# Patient Record
Sex: Female | Born: 1990 | Race: Black or African American | Hispanic: No | Marital: Single | Smoking: Never smoker
Health system: Southern US, Community
[De-identification: ages and names within clinical notes are randomized; demographics above are authoritative.]

## PROBLEM LIST (undated history)

## (undated) ENCOUNTER — Inpatient Hospital Stay (HOSPITAL_COMMUNITY): Payer: Self-pay

## (undated) DIAGNOSIS — F32A Depression, unspecified: Secondary | ICD-10-CM

## (undated) DIAGNOSIS — F329 Major depressive disorder, single episode, unspecified: Secondary | ICD-10-CM

## (undated) DIAGNOSIS — N390 Urinary tract infection, site not specified: Secondary | ICD-10-CM

## (undated) DIAGNOSIS — O039 Complete or unspecified spontaneous abortion without complication: Secondary | ICD-10-CM

## (undated) DIAGNOSIS — B009 Herpesviral infection, unspecified: Secondary | ICD-10-CM

## (undated) DIAGNOSIS — A749 Chlamydial infection, unspecified: Secondary | ICD-10-CM

## (undated) DIAGNOSIS — R071 Chest pain on breathing: Secondary | ICD-10-CM

## (undated) DIAGNOSIS — I10 Essential (primary) hypertension: Secondary | ICD-10-CM

## (undated) DIAGNOSIS — F419 Anxiety disorder, unspecified: Secondary | ICD-10-CM

## (undated) DIAGNOSIS — Z3490 Encounter for supervision of normal pregnancy, unspecified, unspecified trimester: Secondary | ICD-10-CM

## (undated) DIAGNOSIS — I2699 Other pulmonary embolism without acute cor pulmonale: Secondary | ICD-10-CM

## (undated) HISTORY — DX: Complete or unspecified spontaneous abortion without complication: O03.9

## (undated) HISTORY — DX: Chest pain on breathing: R07.1

## (undated) HISTORY — PX: THERAPEUTIC ABORTION: SHX798

## (undated) HISTORY — DX: Encounter for supervision of normal pregnancy, unspecified, unspecified trimester: Z34.90

## (undated) HISTORY — DX: Herpesviral infection, unspecified: B00.9

---
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
## 2012-05-31 LAB — OB RESULTS CONSOLE ANTIBODY SCREEN: Antibody Screen: NEGATIVE

## 2012-05-31 LAB — OB RESULTS CONSOLE GC/CHLAMYDIA
Chlamydia: NEGATIVE
Gonorrhea: NEGATIVE

## 2012-05-31 LAB — OB RESULTS CONSOLE RUBELLA ANTIBODY, IGM: Rubella: IMMUNE

## 2012-09-25 ENCOUNTER — Inpatient Hospital Stay (HOSPITAL_COMMUNITY)
Admission: AD | Admit: 2012-09-25 | Discharge: 2012-09-25 | Disposition: A | Discharge status: Home / Self Care | Payer: Medicaid Other | Source: Ambulatory Visit | Attending: Obstetrics and Gynecology | Admitting: Obstetrics and Gynecology

## 2012-09-25 ENCOUNTER — Encounter (HOSPITAL_COMMUNITY): Payer: Self-pay | Admitting: *Deleted

## 2012-09-25 DIAGNOSIS — O34219 Maternal care for unspecified type scar from previous cesarean delivery: Secondary | ICD-10-CM

## 2012-09-25 DIAGNOSIS — O99891 Other specified diseases and conditions complicating pregnancy: Secondary | ICD-10-CM | POA: Insufficient documentation

## 2012-09-25 DIAGNOSIS — N760 Acute vaginitis: Secondary | ICD-10-CM | POA: Insufficient documentation

## 2012-09-25 DIAGNOSIS — O2390 Unspecified genitourinary tract infection in pregnancy, unspecified trimester: Secondary | ICD-10-CM | POA: Insufficient documentation

## 2012-09-25 DIAGNOSIS — B9689 Other specified bacterial agents as the cause of diseases classified elsewhere: Secondary | ICD-10-CM | POA: Insufficient documentation

## 2012-09-25 DIAGNOSIS — A499 Bacterial infection, unspecified: Secondary | ICD-10-CM | POA: Insufficient documentation

## 2012-09-25 HISTORY — DX: Depression, unspecified: F32.A

## 2012-09-25 HISTORY — DX: Urinary tract infection, site not specified: N39.0

## 2012-09-25 HISTORY — DX: Chlamydial infection, unspecified: A74.9

## 2012-09-25 HISTORY — DX: Anxiety disorder, unspecified: F41.9

## 2012-09-25 HISTORY — DX: Major depressive disorder, single episode, unspecified: F32.9

## 2012-09-25 LAB — URINALYSIS, ROUTINE W REFLEX MICROSCOPIC
Bilirubin Urine: NEGATIVE
Hgb urine dipstick: NEGATIVE
Ketones, ur: NEGATIVE mg/dL
Protein, ur: NEGATIVE mg/dL
Urobilinogen, UA: 1 mg/dL (ref 0.0–1.0)

## 2012-09-25 LAB — POCT FERN TEST: Fern Test: NEGATIVE

## 2012-09-25 MED ORDER — METRONIDAZOLE 500 MG PO TABS: 500.0000 mg | ORAL_TABLET | Freq: Two times a day (BID) | ORAL | Status: AC

## 2012-09-25 NOTE — MAU Provider Note (Signed)
History     CSN: [id number]  Arrival date and time: 09/25/12 6644   First Provider Initiated Contact with Patient 09/25/12 0945     Chief Complaint  Patient presents with  . Leaking fluid    HPI Debra Townsend y.o.G3P1011 [redacted]w[redacted]d with a previous history of PROM at [redacted] weeks gestation with a previous pregnancy, presented to the MAU today with a history of leaking a small amount of fluid around 0400 this morning. She reports she got up to void at that time and could not produce urine. She noticed her pajama pants were a little wet, but did not lok to see if it it had a color. She went back to bed. She wore a pad in and did not notice anything on it when she removed it. She denies dysuria, frequency or feelings of incomplete void. She denies fever or vaginal bleeding. She has felt her baby move and reports it is active. She has had no complications with this pregnancy and reports no complications with prior pregnancies except for the PROM with her last pregnancy.  She endorses some back pian over the last couple of days. She is not feeling any contractions.  OB History    Grav Para Term Preterm Abortions TAB SAB Ect Mult Living   3 1 1  0 1 1 0 0  1      Past Medical History  Diagnosis Date  . Urinary tract infection   . Asthma     childhood  . Anxiety   . Depression     No treatment, being monitored by Dr. Emelda Fear  . Chlamydia     Past Surgical History  Procedure Date  . Therapeutic abortion   . Cesarean section     History reviewed. No pertinent family history.  History  Substance Use Topics  . Smoking status: Former Smoker    Types: Cigarettes  . Smokeless tobacco: Never Used  . Alcohol Use: No    Allergies:  Allergies  Allergen Reactions  . Penicillins Hives    No prescriptions prior to admission    ROS Physical Exam   Blood pressure 110/65, pulse 100, temperature 97.5 F (36.4 C), temperature source Oral, resp. rate 18, height 5' (1.524 m), weight  78.019 kg (172 lb).  Physical Exam  Constitutional: She appears well-developed and well-nourished. No distress.  Eyes: Pupils are equal, round, and reactive to light. Right eye exhibits no discharge. Left eye exhibits no discharge. No scleral icterus.  Cardiovascular: Normal rate, regular rhythm and normal heart sounds.  Exam reveals no gallop and no friction rub.   No murmur heard. Respiratory: Effort normal and breath sounds normal. No respiratory distress. She has no wheezes. She has no rales.  GI: Soft. Bowel sounds are normal.       Gravid No contractions palpated  Genitourinary: Vaginal discharge found.       Spec: Patient was pulling away from exam d/t dryness. Normal external genitalia. No lesions or ulcers. Normal vagina mucosa. Small amount of white discharge noted. No odor. No pooling or fluid noted. No fluid noted on pad. Cervical OS visualized and no fluid or discharge from the os. Os was closed. Bimanual: Closed/thick/posterior  Skin: She is not diaphoretic.   EFM: FHR: 135's with accelerations. No contractions.  MAU Course  Procedures Fern test Wet prep EFM  Assessment and Plan  1. Fern- NEGATIVE 2. Wet prep- Positive for clue cells 3. Bacteria Vaginosis: Flagyl BID x7 days 4. Discharge home with preterm labor/PPROM  AVS Felix Pacini 09/25/2012, 10:16 AM   Seen and agree  Wynelle Bourgeois CNM

## 2012-09-25 NOTE — MAU Note (Signed)
Felt leaking around 0400. Intermittent leaking since then. Cramping, "pulling" started shortly after first episode of leaking. Goes to [facility]. Has appointment today. Did not feel she could wait.

## 2012-09-26 NOTE — MAU Provider Note (Signed)
Attestation of Attending Supervision of Advanced Practitioner (CNM/NP): Evaluation and management procedures were performed by the Advanced Practitioner under my supervision and collaboration.  I have reviewed the Advanced Practitioner's note and chart, and I agree with the management and plan.  Lindell Renfrew 09/26/2012 5:29 AM

## 2012-10-25 LAB — OB RESULTS CONSOLE GBS: GBS: NEGATIVE

## 2012-11-06 ENCOUNTER — Inpatient Hospital Stay (HOSPITAL_COMMUNITY)
Admission: AD | Admit: 2012-11-06 | Discharge: 2012-11-06 | Disposition: A | Discharge status: Home / Self Care | Payer: Medicaid Other | Source: Ambulatory Visit | Attending: Obstetrics & Gynecology | Admitting: Obstetrics & Gynecology

## 2012-11-06 ENCOUNTER — Encounter (HOSPITAL_COMMUNITY): Payer: Self-pay | Admitting: *Deleted

## 2012-11-06 DIAGNOSIS — O212 Late vomiting of pregnancy: Secondary | ICD-10-CM | POA: Insufficient documentation

## 2012-11-06 DIAGNOSIS — K2990 Gastroduodenitis, unspecified, without bleeding: Secondary | ICD-10-CM | POA: Insufficient documentation

## 2012-11-06 DIAGNOSIS — K2970 Gastritis, unspecified, without bleeding: Secondary | ICD-10-CM | POA: Insufficient documentation

## 2012-11-06 DIAGNOSIS — O99891 Other specified diseases and conditions complicating pregnancy: Secondary | ICD-10-CM | POA: Insufficient documentation

## 2012-11-06 DIAGNOSIS — O34219 Maternal care for unspecified type scar from previous cesarean delivery: Secondary | ICD-10-CM

## 2012-11-06 DIAGNOSIS — R197 Diarrhea, unspecified: Secondary | ICD-10-CM | POA: Insufficient documentation

## 2012-11-06 LAB — URINALYSIS, ROUTINE W REFLEX MICROSCOPIC
Ketones, ur: NEGATIVE mg/dL
Leukocytes, UA: NEGATIVE
Nitrite: NEGATIVE
Specific Gravity, Urine: 1.02 (ref 1.005–1.030)
pH: 6.5 (ref 5.0–8.0)

## 2012-11-06 MED ORDER — PROMETHAZINE HCL 50 MG PO TABS: 50.0000 mg | ORAL_TABLET | Freq: Four times a day (QID) | ORAL | Status: DC | PRN

## 2012-11-06 MED ORDER — PROMETHAZINE HCL 25 MG PO TABS
25.0000 mg | ORAL_TABLET | Freq: Four times a day (QID) | ORAL | Status: DC | PRN
  Administered 2012-11-06: 25 mg via ORAL
  Filled 2012-11-06: qty 1

## 2012-11-06 NOTE — MAU Provider Note (Signed)
Patient was seen and examined by Cam Hai, CNM who agreed with the plan as outlined by Corky Downs.  Attestation of Attending Supervision of Advanced Practitioner (CNM/NP): Evaluation and management procedures were performed by the Advanced Practitioner under my supervision and collaboration.  I have reviewed the Advanced Practitioner's note and chart, and I agree with the management and plan.  Jaynie Collins, MD, FACOG Attending Obstetrician & Gynecologist Faculty Practice, [facility]

## 2012-11-06 NOTE — MAU Provider Note (Signed)
History     CSN: [id number]  Arrival date and time: 11/06/12 1025   None     Chief Complaint  Patient presents with  . Nausea  . Diarrhea  . Abdominal Cramping   HPI  Debra Townsend is a 21 y.o. G3P1011 at [redacted]w[redacted]d presenting with nausea, vomiting, and diarrhea that started this morning. She also has suprapubic pressure. Denies fever, chills, vaginal discharge or leakage. No contractions. She is concerned she is in labor because this is the same thing that happened with her previous pregnancy. She denies exposure to ill contacts but did visit her Aunt at the hospital yesterday.   Past Medical History  Diagnosis Date  . Urinary tract infection   . Asthma     childhood  . Anxiety   . Depression     No treatment, being monitored by Dr. Emelda Fear  . Chlamydia     Past Surgical History  Procedure Date  . Therapeutic abortion   . Cesarean section     Family History  Problem Relation Age of Onset  . Other Neg Hx     History  Substance Use Topics  . Smoking status: Former Smoker    Types: Cigarettes  . Smokeless tobacco: Never Used  . Alcohol Use: No    Allergies:  Allergies  Allergen Reactions  . Penicillins Hives    Prescriptions prior to admission  Medication Sig Dispense Refill  . acyclovir (ZOVIRAX) 400 MG tablet Take 400 mg by mouth 3 (three) times daily.        Review of Systems  Constitutional: Negative for fever, chills and diaphoresis.  Gastrointestinal: Positive for nausea, vomiting, abdominal pain and diarrhea. Negative for blood in stool.  Genitourinary: Negative for dysuria, hematuria and flank pain.  Musculoskeletal: Positive for back pain.  Neurological: Negative for weakness.   Physical Exam   Blood pressure 113/75, pulse 94, temperature 97.3 F (36.3 C), temperature source Oral, resp. rate 18, height 5' (1.524 m), weight 78.472 kg (173 lb).  Physical Exam  Constitutional: She is oriented to person, place, and time. She appears  well-developed and well-nourished. No distress.  Cardiovascular: Normal rate, regular rhythm and normal heart sounds.   Respiratory: Effort normal and breath sounds normal.  GI: Normal appearance and bowel sounds are normal. There is tenderness in the suprapubic area. There is no CVA tenderness.  Neurological: She is alert and oriented to person, place, and time.  Skin: Skin is warm and dry. She is not diaphoretic.  Psychiatric: She has a normal mood and affect. Her behavior is normal.  EFM: baseline 140s, + accels, no decels Cx: 0/thick/posterior, vtx -3  MAU Course  Procedures  MDM  Results for orders placed during the hospital encounter of 11/06/12 (from the past 24 hour(s))  URINALYSIS, ROUTINE W REFLEX MICROSCOPIC     Status: Normal   Collection Time   11/06/12 10:40 AM      Component Value Range   Color, Urine YELLOW  YELLOW   APPearance CLEAR  CLEAR   Specific Gravity, Urine 1.020  1.005 - 1.030   pH 6.5  5.0 - 8.0   Glucose, UA NEGATIVE  NEGATIVE mg/dL   Hgb urine dipstick NEGATIVE  NEGATIVE   Bilirubin Urine NEGATIVE  NEGATIVE   Ketones, ur NEGATIVE  NEGATIVE mg/dL   Protein, ur NEGATIVE  NEGATIVE mg/dL   Urobilinogen, UA 0.2  0.0 - 1.0 mg/dL   Nitrite NEGATIVE  NEGATIVE   Leukocytes, UA NEGATIVE  NEGATIVE  Assessment and Plan  IUP at 38.2wks Nausea, vomiting, and diarrhea secondary to gastritis Prescription for phenergan to control nausea Encouraged to hydrate F/U with OB for routine care  Corky Downs 11/06/2012, 12:12 PM

## 2012-11-06 NOTE — MAU Note (Signed)
Symptoms started this morning, nausea, vomting and vomiting.  abd cramping- sharp pains.  Had been to see a family member in the hosp yesterday- ? Exposure to virus.

## 2012-11-14 ENCOUNTER — Encounter (HOSPITAL_COMMUNITY): Payer: Self-pay | Admitting: *Deleted

## 2012-11-14 ENCOUNTER — Inpatient Hospital Stay (HOSPITAL_COMMUNITY)
Admission: AD | Admit: 2012-11-14 | Discharge: 2012-11-18 | DRG: 765 | Disposition: A | Discharge status: Home / Self Care | Payer: Medicaid Other | Source: Ambulatory Visit | Attending: Obstetrics & Gynecology | Admitting: Obstetrics & Gynecology

## 2012-11-14 DIAGNOSIS — O9902 Anemia complicating childbirth: Secondary | ICD-10-CM | POA: Diagnosis present

## 2012-11-14 DIAGNOSIS — O4100X0 Oligohydramnios, unspecified trimester, not applicable or unspecified: Principal | ICD-10-CM | POA: Diagnosis present

## 2012-11-14 DIAGNOSIS — O34219 Maternal care for unspecified type scar from previous cesarean delivery: Secondary | ICD-10-CM | POA: Diagnosis present

## 2012-11-14 DIAGNOSIS — D573 Sickle-cell trait: Secondary | ICD-10-CM | POA: Diagnosis present

## 2012-11-14 DIAGNOSIS — O365990 Maternal care for other known or suspected poor fetal growth, unspecified trimester, not applicable or unspecified: Secondary | ICD-10-CM | POA: Diagnosis present

## 2012-11-14 LAB — TYPE AND SCREEN
ABO/RH(D): B POS
Antibody Screen: NEGATIVE

## 2012-11-14 LAB — RPR: RPR Ser Ql: NONREACTIVE

## 2012-11-14 LAB — CBC
Hemoglobin: 9.6 g/dL — ABNORMAL LOW (ref 12.0–15.0)
MCH: 23.6 pg — ABNORMAL LOW (ref 26.0–34.0)
MCHC: 31.1 g/dL (ref 30.0–36.0)

## 2012-11-14 LAB — RAPID URINE DRUG SCREEN, HOSP PERFORMED
Amphetamines: NOT DETECTED
Barbiturates: NOT DETECTED

## 2012-11-14 MED ORDER — OXYTOCIN BOLUS FROM INFUSION: 500.0000 mL | INTRAVENOUS | Status: DC

## 2012-11-14 MED ORDER — OXYCODONE-ACETAMINOPHEN 5-325 MG PO TABS: 1.0000 | ORAL_TABLET | ORAL | Status: DC | PRN

## 2012-11-14 MED ORDER — LACTATED RINGERS IV SOLN: 500.0000 mL | INTRAVENOUS | Status: DC | PRN

## 2012-11-14 MED ORDER — CITRIC ACID-SODIUM CITRATE 334-500 MG/5ML PO SOLN
30.0000 mL | ORAL | Status: DC | PRN
  Filled 2012-11-14: qty 15

## 2012-11-14 MED ORDER — FLEET ENEMA 7-19 GM/118ML RE ENEM: 1.0000 | ENEMA | RECTAL | Status: DC | PRN

## 2012-11-14 MED ORDER — ACYCLOVIR 400 MG PO TABS
400.0000 mg | ORAL_TABLET | Freq: Three times a day (TID) | ORAL | Status: DC
  Filled 2012-11-14 (×3): qty 1

## 2012-11-14 MED ORDER — IBUPROFEN 600 MG PO TABS: 600.0000 mg | ORAL_TABLET | Freq: Four times a day (QID) | ORAL | Status: DC | PRN

## 2012-11-14 MED ORDER — ACYCLOVIR 200 MG PO CAPS
400.0000 mg | ORAL_CAPSULE | Freq: Three times a day (TID) | ORAL | Status: DC
  Administered 2012-11-14 – 2012-11-15 (×4): 400 mg via ORAL
  Filled 2012-11-14 (×5): qty 2

## 2012-11-14 MED ORDER — ONDANSETRON HCL 4 MG/2ML IJ SOLN
4.0000 mg | Freq: Four times a day (QID) | INTRAMUSCULAR | Status: DC | PRN
  Administered 2012-11-16: 4 mg via INTRAVENOUS
  Filled 2012-11-14: qty 2

## 2012-11-14 MED ORDER — ACETAMINOPHEN 325 MG PO TABS
650.0000 mg | ORAL_TABLET | ORAL | Status: DC | PRN
  Administered 2012-11-14 – 2012-11-15 (×2): 650 mg via ORAL
  Filled 2012-11-14 (×2): qty 2

## 2012-11-14 MED ORDER — LIDOCAINE HCL (PF) 1 % IJ SOLN: 30.0000 mL | INTRAMUSCULAR | Status: DC | PRN

## 2012-11-14 MED ORDER — OXYTOCIN 40 UNITS IN LACTATED RINGERS INFUSION - SIMPLE MED: 62.5000 mL/h | INTRAVENOUS | Status: DC

## 2012-11-14 MED ORDER — LACTATED RINGERS IV SOLN
INTRAVENOUS | Status: DC
  Administered 2012-11-14: 125 mL/h via INTRAVENOUS
  Administered 2012-11-15 – 2012-11-16 (×5): via INTRAVENOUS

## 2012-11-14 NOTE — H&P (Signed)
Debra Townsend is a 21 y.o. female [redacted]w[redacted]d at [redacted]w[redacted]d by LMP consistent with 15w ultrasound, admitted for IOL for IUGR <3%ile with oligohydramnios (AFI 4.59); no growth noted on ultrasound since 11/20. Presentation is vertex. FHR wnl by Doppler in clinic. Previous pregnancy was a c-section for nonreassuring fetal heart tones at 36w, in 2012. Pt has signed consent for TOLAC though she has some questions regarding relative safety of c-section vs TOLAC for baby given IUGR.  Pt reports no bleeding or definite LOF; pt does have oligo and reports perhaps some trickling of fluids (states she has had a few times where she has urinated and thought she was finished, with some continued feeling of fluids after 'finishing'). Some reduced fetal movements over the past week compared to previously, but still feeling movements. Reports no headaches or change in vision (blurriness, double vision, spots/flashes), no RUQ pain; does endorse some blurred vision earlier in pregnancy and some last week. Reports no new/worse swelling/edema. No fever/chills, N/V.  Pt was seen for prenatal care by [facility]. BP elevated in clinic today, 140/80; pt with persistent 1+ protein on UA's throughout pregnancy.  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 Pt was positive for Chlamydia on 11/13 and received treatment; test of cure pending, specimen collected in clinic today. Hx UDS+ for THC during pregnancy. 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  Maternal Medical History:  Reason for admission: Here for IOL for oligohydramnios, IUGR <3%ile  Contractions: Denies contractions currently; some weak/intermittent over the last week  Fetal activity: Perceived fetal activity is recently changed.   Last perceived fetal movement was within the past hour.   Pt states she has felt less fetal movement than normal in the past week or so but is still feeling movements.  Prenatal complications: IUGR and oligohydramnios.   No bleeding  or pre-eclampsia.   PIH: few elevated BP's in clinic today and last week.   Prenatal Complications - Diabetes: none.    OB History    Grav Para Term Preterm Abortions TAB SAB Ect Mult Living   3 1 0 1 1 1  0 0  1     Past Medical History  Diagnosis Date  . Urinary tract infection   . Asthma     childhood  . Anxiety   . Depression     No treatment, being monitored by Dr. Emelda Fear  . Chlamydia    Past Surgical History  Procedure Date  . Therapeutic abortion   . Cesarean section    Family History: family history is negative for Other. Social History:  reports that she has quit smoking. Her smoking use included Cigarettes. She has never used smokeless tobacco. She reports that she uses illicit drugs (Marijuana). She reports that she does not drink alcohol.  ROS: See HPI    Filed Vitals:   11/14/12 2157  BP: 127/79  Pulse: 97  Temp:   Resp:     Maternal Exam:  Uterine Assessment: No current contractions  Abdomen: Surgical scars: low transverse.   Fetal presentation: vertex  Introitus: Normal vulva. Normal vagina.  Ferning test: not done.  Nitrazine test: not done. Amniotic fluid character: not assessed.     Fetal Exam Fetal Monitor Review: Baseline rate: 150.  Baseline appears to be around 150. However, there are almost continuous accelerations to 170s and 180s. Variability is moderate. There are one or two very short (10 sec), sharp variables early in the strip.     Physical  Exam  Vitals reviewed. Constitutional: She is oriented to person, place, and time. She appears well-developed and well-nourished.  HENT:  Head: Normocephalic and atraumatic.  Eyes: Conjunctivae normal are normal. Pupils are equal, round, and reactive to light.  Neck: Normal range of motion. Neck supple.  Cardiovascular: Normal rate, regular rhythm and normal heart sounds.   No murmur heard. Respiratory: Effort normal and breath sounds normal. She has no wheezes.  GI: Soft. She  exhibits no distension. There is no tenderness.       Gravid  Genitourinary: Pelvic exam was performed with patient prone.  Musculoskeletal: Normal range of motion. She exhibits no edema.  Neurological: She is alert and oriented to person, place, and time.  Skin: Skin is warm and dry.  Psychiatric: She has a normal mood and affect. Her behavior is normal.    Filed Vitals:   11/14/12 1830 11/14/12 1959 11/14/12 2157  BP: 121/62 89/73 127/79  Pulse: 107 96 97  Temp: 98.3 F (36.8 C) 98.1 F (36.7 C)   TempSrc: Oral Oral   Resp: 20 18   Height: 5' (1.524 m)    Weight: 79.379 kg (175 lb)       Prenatal labs: ABO, Rh: B/Positive/-- (06/19 0000) Antibody: Negative (06/19 0000) Rubella: Immune (06/19 0000) RPR:    HBsAg: Negative (06/19 0000)  HIV: Non-reactive (06/19 0000)  GBS: Negative (11/13 0000)   Assessment/Plan: 21 y.o. G3P1011 at [redacted]w[redacted]d by LMP consistent with 15w [place] presenting for IOL due to IUGR/oligo  Patient had previously signed consent for TOLAC but concerned about baby's ability to tolerate labor due to IUGR. Dr. Erin Fulling present to discuss risks and benefits of TOLAC vs c-section for baby and mother. Specifically, discussed risk of fetal intolerance to labor. Patient advised that we will monitor fetal heart rate continuously and very closely with low threshold for intervention with cesarean section if any signs of fetal distress. Discussed available methods of induction for TOLAC and patient advised that we can attempt foley bulb and eventually pitocin once cervix is favorable. After discussion, patient agreeable to proceed with TOLAC and induction starting with foley bulb.   - GBS negative - Chlamydia 11/13, treated, test of cure pending.  - Continue Acyclovir for HSV2 suppression. No visible lesions on exam - History of positive urine drug screen for THC earlier in pregnancy; repeat UDS - Elevated BP in last visit to office. BP normal here so far -  monitor.  Above discussed with Dr. Janine Limbo, Cristal Deer 11/14/2012, 6:22 PM  CO-SIGN I have seen and examined patient and agree with above.  Cervical exam:  1/50/5/-3, very posterior Foley bulb placed successfully, though difficult due to posterior cervix. Membranes stripped.  Vertex by exam. Fetal heart tracings reviewed.  Napoleon Form, MD

## 2012-11-14 NOTE — Progress Notes (Signed)
Debra Townsend is a 21 y.o. G3P1011 at [redacted]w[redacted]d by LMP admitted for induction of labor due to Low amniotic fluid and poor fetal growth. Pt had a prior c-section at 36 weeks due to non reassuring fetal heart rate.  She has been followed this pregnancy for poor growth and today was noted to have a low AFI of 4.3cm.  Pt admitted for delivery. Subjective:   Objective: BP 127/79  Pulse 97  Temp 98.1 F (36.7 C) (Oral)  Resp 18  Ht 5' (1.524 m)  Wt 79.379 kg (175 lb)  BMI 34.18 kg/m2       Labs: Lab Results  Component Value Date   WBC 9.1 11/14/2012   HGB 9.6* 11/14/2012   HCT 30.9* 11/14/2012   MCV 76.1* 11/14/2012   PLT 191 11/14/2012    Assessment / Plan: Pt at term with oligohydramnios and poor fetal growth.  reportedly normal umbilical dopplers.  D/w pt and mother the risks of trial of labor after c-section.  I also reviewed with her the potential risks of a fetus with poor growth.  I reviewed with her the risk of fetal compromise.  Pt and her mother wish to attempt with a vaginal delivery.  I explained to them that ANY fetal heart rate abnormalities could potentially result in a repeat c-section.  Both patient and  Her mother expressed understanding of the risks and plan of care. I spent 20-38min at the patients bedside explaining the risks and alternatives from.    Townsend, Debra Schlink 11/14/2012, 10:46 PM (late entry)

## 2012-11-14 NOTE — Progress Notes (Signed)
Dr Thad Ranger at bedside.  RN updated MD on fhr baseline

## 2012-11-15 ENCOUNTER — Encounter (HOSPITAL_COMMUNITY): Payer: Self-pay | Admitting: Anesthesiology

## 2012-11-15 LAB — CBC
HCT: 30.2 % — ABNORMAL LOW (ref 36.0–46.0)
Hemoglobin: 9.3 g/dL — ABNORMAL LOW (ref 12.0–15.0)
MCH: 23.7 pg — ABNORMAL LOW (ref 26.0–34.0)
MCHC: 30.8 g/dL (ref 30.0–36.0)

## 2012-11-15 MED ORDER — LIDOCAINE HCL (PF) 1 % IJ SOLN
INTRAMUSCULAR | Status: DC | PRN
  Administered 2012-11-15 (×2): 8 mL

## 2012-11-15 MED ORDER — LACTATED RINGERS IV SOLN: 500.0000 mL | Freq: Once | INTRAVENOUS | Status: DC

## 2012-11-15 MED ORDER — FENTANYL 2.5 MCG/ML BUPIVACAINE 1/10 % EPIDURAL INFUSION (WH - ANES)
14.0000 mL/h | INTRAMUSCULAR | Status: DC
Start: 2012-11-15 — End: 2012-11-16
  Administered 2012-11-16: 14 mL/h via EPIDURAL
  Filled 2012-11-15 (×2): qty 125

## 2012-11-15 MED ORDER — FENTANYL 2.5 MCG/ML BUPIVACAINE 1/10 % EPIDURAL INFUSION (WH - ANES)
INTRAMUSCULAR | Status: DC | PRN
  Administered 2012-11-15: 14 mL/h via EPIDURAL

## 2012-11-15 MED ORDER — PHENYLEPHRINE 40 MCG/ML (10ML) SYRINGE FOR IV PUSH (FOR BLOOD PRESSURE SUPPORT)
80.0000 ug | PREFILLED_SYRINGE | INTRAVENOUS | Status: DC | PRN
  Filled 2012-11-15: qty 5

## 2012-11-15 MED ORDER — FENTANYL CITRATE 0.05 MG/ML IJ SOLN
100.0000 ug | INTRAMUSCULAR | Status: DC | PRN
  Administered 2012-11-15: 100 ug via INTRAVENOUS
  Filled 2012-11-15: qty 2

## 2012-11-15 MED ORDER — TERBUTALINE SULFATE 1 MG/ML IJ SOLN: 0.2500 mg | Freq: Once | INTRAMUSCULAR | Status: AC | PRN

## 2012-11-15 MED ORDER — EPHEDRINE 5 MG/ML INJ
10.0000 mg | INTRAVENOUS | Status: DC | PRN
  Filled 2012-11-15: qty 4

## 2012-11-15 MED ORDER — PHENYLEPHRINE 40 MCG/ML (10ML) SYRINGE FOR IV PUSH (FOR BLOOD PRESSURE SUPPORT): 80.0000 ug | PREFILLED_SYRINGE | INTRAVENOUS | Status: DC | PRN

## 2012-11-15 MED ORDER — DIPHENHYDRAMINE HCL 50 MG/ML IJ SOLN
12.5000 mg | INTRAMUSCULAR | Status: DC | PRN
  Administered 2012-11-15: 12.5 mg via INTRAVENOUS
  Filled 2012-11-15: qty 1

## 2012-11-15 MED ORDER — EPHEDRINE 5 MG/ML INJ: 10.0000 mg | INTRAVENOUS | Status: DC | PRN

## 2012-11-15 MED ORDER — LIDOCAINE HCL 4 % IJ SOLN: INTRAMUSCULAR | Status: DC | PRN

## 2012-11-15 MED ORDER — OXYTOCIN 40 UNITS IN LACTATED RINGERS INFUSION - SIMPLE MED
1.0000 m[IU]/min | INTRAVENOUS | Status: DC
  Administered 2012-11-15: 2 m[IU]/min via INTRAVENOUS
  Filled 2012-11-15: qty 1000

## 2012-11-15 NOTE — Progress Notes (Addendum)
Debra Townsend is a 21 y.o. G3P1011 at [redacted]w[redacted]d   Subjective: Very uncomfortable with contractions, requesting pain medication  Objective: BP 116/81  Pulse 96  Temp 98.2 F (36.8 C) (Oral)  Resp 18  Ht 5' (1.524 m)  Wt 79.379 kg (175 lb)  BMI 34.18 kg/m2      FHT:  FHR: 130-140 bpm, variability: moderate,  accelerations:  Present,  decelerations:  Present occasional prolonged variables UC:   regular, every 3-5 minutes SVE:   Dilation: 5 Effacement (%): 80 Station: -2 Exam by:: Jade Burright, MD  Labs: Lab Results  Component Value Date   WBC 9.1 11/14/2012   HGB 9.6* 11/14/2012   HCT 30.9* 11/14/2012   MCV 76.1* 11/14/2012   PLT 191 11/14/2012    Assessment / Plan: Induction of labor due to IUGR and oligo,  progressing well on pitocin -s/p foley bulb, now 5 / 80 / -2; will avoid AROM given oligo  Labor: Progressing on Pitocin Fetal Wellbeing:  Category II Pain Control:  Fentanyl IV, order placed for epidural Anticipated MOD:  Anticipate VBAC  Murad Staples 11/15/2012, 6:58 PM

## 2012-11-15 NOTE — Progress Notes (Signed)
   Lamica Mccart is a 21 y.o. G3P1011 at [redacted]w[redacted]d  admitted for induction of labor due to Low amniotic fluid. and Poor fetal growth.  Subjective:  Feels better with epidural Objective: BP 109/68  Pulse 84  Temp 97.8 F (36.6 C) (Oral)  Resp 18  Ht 5' (1.524 m)  Wt 175 lb (79.379 kg)  BMI 34.18 kg/m2  SpO2 100%    FHT:  FHR: 140 bpm, variability: moderate,  accelerations:  Present,  decelerations:  Absent UC:   irregular, every 3-5 minutes SVE:   Dilation: 5.5 Effacement (%): 80 Station: -1 Exam by:: dr. Chelsea Aus Pitocin at 12 mu/min Labs: Lab Results  Component Value Date   WBC 10.5 11/15/2012   HGB 9.3* 11/15/2012   HCT 30.2* 11/15/2012   MCV 76.8* 11/15/2012   PLT 168 11/15/2012    Assessment / Plan: IOL fo r IUGR/oligohydramnios; slow progress; WIll increase pitocin  Labor: Progressing normally Fetal Wellbeing:  Category I Pain Control:  Epidural Anticipated MOD:  NSVD  CRESENZO-DISHMAN,Eloyse Causey 11/15/2012, 9:51 PM

## 2012-11-15 NOTE — Anesthesia Procedure Notes (Signed)
Epidural Patient location during procedure: OB Start time: 11/15/2012 8:27 PM End time: 11/15/2012 8:31 PM  Staffing Anesthesiologist: Sandrea Hughs Performed by: anesthesiologist   Preanesthetic Checklist Completed: patient identified, site marked, surgical consent, pre-op evaluation, timeout performed, IV checked, risks and benefits discussed and monitors and equipment checked  Epidural Patient position: sitting Prep: site prepped and draped and DuraPrep Patient monitoring: continuous pulse ox and blood pressure Approach: midline Injection technique: LOR air  Needle:  Needle type: Tuohy  Needle gauge: 17 G Needle length: 9 cm and 9 Needle insertion depth: 5 cm cm Catheter type: closed end flexible Catheter size: 19 Gauge Catheter at skin depth: 10 cm Test dose: negative and Other  Assessment Sensory level: T8 Events: blood not aspirated, injection not painful, no injection resistance, negative IV test and no paresthesia  Additional Notes Reason for block:procedure for pain

## 2012-11-15 NOTE — Progress Notes (Signed)
Debra Townsend is a 22 y.o. G3P1011 at 106w4d  admitted for induction of labor due to Low amniotic fluid. and IUGR.  She has a history of C/S x1 and desires TOLAC.  Subjective: Pt comfortable, denies need for medication at this time.   Objective: BP 111/57  Pulse 99  Temp 97.9 F (36.6 C) (Axillary)  Resp 18  Ht 5' (1.524 m)  Wt 79.379 kg (175 lb)  BMI 34.18 kg/m2      FHT:  FHR: 140 bpm, variability: moderate,  accelerations:  Present,  decelerations:  Absent UC:   occasional SVE:   Dilation: 4.5 Effacement (%): 60 Station: -3 Exam by:: [place], CNM  Labs: Lab Results  Component Value Date   WBC 9.1 11/14/2012   HGB 9.6* 11/14/2012   HCT 30.9* 11/14/2012   MCV 76.1* 11/14/2012   PLT 191 11/14/2012    Assessment / Plan: S/P foley bulb Plan to start Pitocin   Labor: Progressing normally Fetal Wellbeing:  Category I Pain Control:  Labor support without medications Anticipated MOD:  VBAC  LEFTWICH-KIRBY, Byanca Kasper 11/15/2012, 3:32 PM

## 2012-11-15 NOTE — Anesthesia Preprocedure Evaluation (Signed)
Anesthesia Evaluation  Patient identified by MRN, date of birth, ID band Patient awake    Reviewed: Allergy & Precautions, H&P , NPO status , Patient's Chart, lab work & pertinent test results  Airway Mallampati: II TM Distance: >3 FB Neck ROM: full    Dental No notable dental hx.    Pulmonary    Pulmonary exam normal       Cardiovascular negative cardio ROS      Neuro/Psych negative neurological ROS     GI/Hepatic negative GI ROS, Neg liver ROS,   Endo/Other  negative endocrine ROS  Renal/GU negative Renal ROS  negative genitourinary   Musculoskeletal negative musculoskeletal ROS (+)   Abdominal Normal abdominal exam  (+)   Peds negative pediatric ROS (+)  Hematology negative hematology ROS (+)   Anesthesia Other Findings   Reproductive/Obstetrics (+) Pregnancy                           Anesthesia Physical Anesthesia Plan  ASA: II  Anesthesia Plan: Epidural   Post-op Pain Management:    Induction:   Airway Management Planned:   Additional Equipment:   Intra-op Plan:   Post-operative Plan:   Informed Consent: I have reviewed the patients History and Physical, chart, labs and discussed the procedure including the risks, benefits and alternatives for the proposed anesthesia with the patient or authorized representative who has indicated his/her understanding and acceptance.     Plan Discussed with:   Anesthesia Plan Comments:         Anesthesia Quick Evaluation

## 2012-11-16 ENCOUNTER — Encounter (HOSPITAL_COMMUNITY): Payer: Self-pay | Admitting: *Deleted

## 2012-11-16 ENCOUNTER — Encounter (HOSPITAL_COMMUNITY): Payer: Self-pay | Admitting: Anesthesiology

## 2012-11-16 ENCOUNTER — Inpatient Hospital Stay (HOSPITAL_COMMUNITY): Payer: Medicaid Other | Admitting: Anesthesiology

## 2012-11-16 ENCOUNTER — Encounter (HOSPITAL_COMMUNITY)
Admission: AD | Disposition: A | Discharge status: Home / Self Care | Payer: Self-pay | Source: Ambulatory Visit | Attending: Obstetrics & Gynecology

## 2012-11-16 DIAGNOSIS — O34219 Maternal care for unspecified type scar from previous cesarean delivery: Secondary | ICD-10-CM

## 2012-11-16 DIAGNOSIS — O365990 Maternal care for other known or suspected poor fetal growth, unspecified trimester, not applicable or unspecified: Secondary | ICD-10-CM

## 2012-11-16 DIAGNOSIS — O9902 Anemia complicating childbirth: Secondary | ICD-10-CM

## 2012-11-16 DIAGNOSIS — O4100X0 Oligohydramnios, unspecified trimester, not applicable or unspecified: Secondary | ICD-10-CM

## 2012-11-16 SURGERY — Surgical Case
Anesthesia: Epidural | Site: Abdomen | Wound class: Clean Contaminated

## 2012-11-16 MED ORDER — LANOLIN HYDROUS EX OINT: 1.0000 "application " | TOPICAL_OINTMENT | CUTANEOUS | Status: DC | PRN

## 2012-11-16 MED ORDER — LACTATED RINGERS IV SOLN
INTRAVENOUS | Status: DC
  Administered 2012-11-16: 01:00:00 via INTRAUTERINE

## 2012-11-16 MED ORDER — METOCLOPRAMIDE HCL 5 MG/ML IJ SOLN: 10.0000 mg | Freq: Three times a day (TID) | INTRAMUSCULAR | Status: DC | PRN

## 2012-11-16 MED ORDER — MORPHINE SULFATE 0.5 MG/ML IJ SOLN
INTRAMUSCULAR | Status: AC
  Filled 2012-11-16: qty 10

## 2012-11-16 MED ORDER — DIPHENHYDRAMINE HCL 50 MG/ML IJ SOLN
12.5000 mg | INTRAMUSCULAR | Status: DC | PRN
  Administered 2012-11-16: 12.5 mg via INTRAVENOUS
  Filled 2012-11-16: qty 1

## 2012-11-16 MED ORDER — MORPHINE SULFATE (PF) 0.5 MG/ML IJ SOLN
INTRAMUSCULAR | Status: DC | PRN
  Administered 2012-11-16: 4 mg via EPIDURAL

## 2012-11-16 MED ORDER — LACTATED RINGERS IV SOLN
INTRAVENOUS | Status: DC | PRN
  Administered 2012-11-16: 04:00:00 via INTRAVENOUS

## 2012-11-16 MED ORDER — ONDANSETRON HCL 4 MG PO TABS: 4.0000 mg | ORAL_TABLET | ORAL | Status: DC | PRN

## 2012-11-16 MED ORDER — OXYTOCIN 40 UNITS IN LACTATED RINGERS INFUSION - SIMPLE MED: 62.5000 mL/h | INTRAVENOUS | Status: AC

## 2012-11-16 MED ORDER — HYDROMORPHONE HCL PF 1 MG/ML IJ SOLN: 0.2500 mg | INTRAMUSCULAR | Status: DC | PRN

## 2012-11-16 MED ORDER — DIPHENHYDRAMINE HCL 50 MG/ML IJ SOLN: 25.0000 mg | INTRAMUSCULAR | Status: DC | PRN

## 2012-11-16 MED ORDER — MEPERIDINE HCL 25 MG/ML IJ SOLN
INTRAMUSCULAR | Status: AC
  Filled 2012-11-16: qty 1

## 2012-11-16 MED ORDER — SODIUM BICARBONATE 8.4 % IV SOLN
INTRAVENOUS | Status: DC | PRN
  Administered 2012-11-16: 5 mL via EPIDURAL

## 2012-11-16 MED ORDER — LACTATED RINGERS IV SOLN
INTRAVENOUS | Status: DC
  Administered 2012-11-16 (×2): via INTRAVENOUS

## 2012-11-16 MED ORDER — OXYTOCIN 10 UNIT/ML IJ SOLN
INTRAMUSCULAR | Status: AC
Start: 2012-11-16 — End: 2012-11-16
  Filled 2012-11-16: qty 4

## 2012-11-16 MED ORDER — SCOPOLAMINE 1 MG/3DAYS TD PT72
MEDICATED_PATCH | TRANSDERMAL | Status: AC
  Filled 2012-11-16: qty 1

## 2012-11-16 MED ORDER — ONDANSETRON HCL 4 MG/2ML IJ SOLN
INTRAMUSCULAR | Status: DC | PRN
  Administered 2012-11-16: 4 mg via INTRAVENOUS

## 2012-11-16 MED ORDER — KETOROLAC TROMETHAMINE 30 MG/ML IJ SOLN: 30.0000 mg | Freq: Four times a day (QID) | INTRAMUSCULAR | Status: AC | PRN

## 2012-11-16 MED ORDER — SIMETHICONE 80 MG PO CHEW: 80.0000 mg | CHEWABLE_TABLET | ORAL | Status: DC | PRN

## 2012-11-16 MED ORDER — SIMETHICONE 80 MG PO CHEW
80.0000 mg | CHEWABLE_TABLET | Freq: Three times a day (TID) | ORAL | Status: DC
  Administered 2012-11-16 – 2012-11-18 (×7): 80 mg via ORAL

## 2012-11-16 MED ORDER — MENTHOL 3 MG MT LOZG: 1.0000 | LOZENGE | OROMUCOSAL | Status: DC | PRN

## 2012-11-16 MED ORDER — FENTANYL CITRATE 0.05 MG/ML IJ SOLN
INTRAMUSCULAR | Status: AC
  Filled 2012-11-16: qty 2

## 2012-11-16 MED ORDER — SODIUM CHLORIDE 0.9 % IJ SOLN: 3.0000 mL | INTRAMUSCULAR | Status: DC | PRN

## 2012-11-16 MED ORDER — NALBUPHINE SYRINGE 5 MG/0.5 ML
5.0000 mg | INJECTION | INTRAMUSCULAR | Status: DC | PRN
Start: 2012-11-16 — End: 2012-11-18
  Filled 2012-11-16: qty 1

## 2012-11-16 MED ORDER — KETOROLAC TROMETHAMINE 60 MG/2ML IM SOLN
INTRAMUSCULAR | Status: AC
  Filled 2012-11-16: qty 2

## 2012-11-16 MED ORDER — FENTANYL CITRATE 0.05 MG/ML IJ SOLN
INTRAMUSCULAR | Status: AC
Start: 2012-11-16 — End: 2012-11-16
  Filled 2012-11-16: qty 5

## 2012-11-16 MED ORDER — GENTAMICIN SULFATE 40 MG/ML IJ SOLN
INTRAVENOUS | Status: AC
  Administered 2012-11-16: 100 mL via INTRAVENOUS
  Filled 2012-11-16: qty 7.39

## 2012-11-16 MED ORDER — SCOPOLAMINE 1 MG/3DAYS TD PT72
1.0000 | MEDICATED_PATCH | Freq: Once | TRANSDERMAL | Status: DC
  Administered 2012-11-16: 1.5 mg via TRANSDERMAL

## 2012-11-16 MED ORDER — IBUPROFEN 600 MG PO TABS
600.0000 mg | ORAL_TABLET | Freq: Four times a day (QID) | ORAL | Status: DC
  Administered 2012-11-16 – 2012-11-18 (×9): 600 mg via ORAL
  Filled 2012-11-16 (×8): qty 1

## 2012-11-16 MED ORDER — DIPHENHYDRAMINE HCL 50 MG/ML IJ SOLN
INTRAMUSCULAR | Status: AC
  Administered 2012-11-16: 12.5 mg
  Filled 2012-11-16: qty 1

## 2012-11-16 MED ORDER — ONDANSETRON HCL 4 MG/2ML IJ SOLN: 4.0000 mg | INTRAMUSCULAR | Status: DC | PRN

## 2012-11-16 MED ORDER — SENNOSIDES-DOCUSATE SODIUM 8.6-50 MG PO TABS
2.0000 | ORAL_TABLET | Freq: Every day | ORAL | Status: DC
  Administered 2012-11-16: 2 via ORAL

## 2012-11-16 MED ORDER — OXYCODONE-ACETAMINOPHEN 5-325 MG PO TABS
1.0000 | ORAL_TABLET | ORAL | Status: DC | PRN
  Administered 2012-11-17 – 2012-11-18 (×2): 1 via ORAL
  Filled 2012-11-16 (×2): qty 1

## 2012-11-16 MED ORDER — NALOXONE HCL 1 MG/ML IJ SOLN
1.0000 ug/kg/h | INTRAVENOUS | Status: DC | PRN
  Filled 2012-11-16: qty 2

## 2012-11-16 MED ORDER — NALBUPHINE SYRINGE 5 MG/0.5 ML
INJECTION | INTRAMUSCULAR | Status: AC
  Administered 2012-11-16: 10 mg via INTRAVENOUS
  Filled 2012-11-16: qty 0.5

## 2012-11-16 MED ORDER — DIPHENHYDRAMINE HCL 25 MG PO CAPS
25.0000 mg | ORAL_CAPSULE | Freq: Four times a day (QID) | ORAL | Status: DC | PRN
  Filled 2012-11-16: qty 1

## 2012-11-16 MED ORDER — PRENATAL MULTIVITAMIN CH
1.0000 | ORAL_TABLET | Freq: Every day | ORAL | Status: DC
  Administered 2012-11-16 – 2012-11-18 (×3): 1 via ORAL
  Filled 2012-11-16 (×2): qty 1

## 2012-11-16 MED ORDER — DIPHENHYDRAMINE HCL 25 MG PO CAPS
25.0000 mg | ORAL_CAPSULE | ORAL | Status: DC | PRN
  Administered 2012-11-16 (×2): 25 mg via ORAL
  Filled 2012-11-16 (×2): qty 1

## 2012-11-16 MED ORDER — DIBUCAINE 1 % RE OINT: 1.0000 "application " | TOPICAL_OINTMENT | RECTAL | Status: DC | PRN

## 2012-11-16 MED ORDER — ONDANSETRON HCL 4 MG/2ML IJ SOLN: 4.0000 mg | Freq: Three times a day (TID) | INTRAMUSCULAR | Status: DC | PRN

## 2012-11-16 MED ORDER — WITCH HAZEL-GLYCERIN EX PADS: 1.0000 "application " | MEDICATED_PAD | CUTANEOUS | Status: DC | PRN

## 2012-11-16 MED ORDER — KETOROLAC TROMETHAMINE 60 MG/2ML IM SOLN
60.0000 mg | Freq: Once | INTRAMUSCULAR | Status: AC | PRN
  Administered 2012-11-16: 60 mg via INTRAMUSCULAR

## 2012-11-16 MED ORDER — NALOXONE HCL 0.4 MG/ML IJ SOLN: 0.4000 mg | INTRAMUSCULAR | Status: DC | PRN

## 2012-11-16 MED ORDER — MEPERIDINE HCL 25 MG/ML IJ SOLN
6.2500 mg | INTRAMUSCULAR | Status: DC | PRN
  Administered 2012-11-16: 6.25 mg via INTRAVENOUS

## 2012-11-16 MED ORDER — FENTANYL CITRATE 0.05 MG/ML IJ SOLN
INTRAMUSCULAR | Status: DC | PRN
  Administered 2012-11-16 (×2): 100 ug via INTRAVENOUS

## 2012-11-16 MED ORDER — NALBUPHINE HCL 10 MG/ML IJ SOLN
5.0000 mg | INTRAMUSCULAR | Status: DC | PRN
  Administered 2012-11-16: 5 mg via INTRAVENOUS
  Administered 2012-11-16: 10 mg via INTRAVENOUS
  Administered 2012-11-16: 5 mg via INTRAVENOUS
  Administered 2012-11-17: 10 mg via INTRAVENOUS
  Filled 2012-11-16 (×3): qty 1

## 2012-11-16 MED ORDER — MEPERIDINE HCL 25 MG/ML IJ SOLN: 6.2500 mg | INTRAMUSCULAR | Status: DC | PRN

## 2012-11-16 MED ORDER — OXYTOCIN 10 UNIT/ML IJ SOLN
40.0000 [IU] | INTRAVENOUS | Status: DC | PRN
  Administered 2012-11-16: 40 [IU] via INTRAVENOUS

## 2012-11-16 MED ORDER — PHENYLEPHRINE HCL 10 MG/ML IJ SOLN
INTRAMUSCULAR | Status: DC | PRN
  Administered 2012-11-16: 40 ug via INTRAVENOUS

## 2012-11-16 MED ORDER — TETANUS-DIPHTH-ACELL PERTUSSIS 5-2.5-18.5 LF-MCG/0.5 IM SUSP: 0.5000 mL | Freq: Once | INTRAMUSCULAR | Status: DC

## 2012-11-16 MED ORDER — PROMETHAZINE HCL 25 MG/ML IJ SOLN: 6.2500 mg | INTRAMUSCULAR | Status: DC | PRN

## 2012-11-16 MED ORDER — KETOROLAC TROMETHAMINE 30 MG/ML IJ SOLN: 15.0000 mg | Freq: Once | INTRAMUSCULAR | Status: DC | PRN

## 2012-11-16 SURGICAL SUPPLY — 30 items
CONTAINER PREFILL 10% NBF 15ML (MISCELLANEOUS) IMPLANT
DRESSING TELFA 8X3 (GAUZE/BANDAGES/DRESSINGS) IMPLANT
DRSG COVADERM 4X10 (GAUZE/BANDAGES/DRESSINGS) ×2 IMPLANT
DURAPREP 26ML APPLICATOR (WOUND CARE) ×2 IMPLANT
ELECT REM PT RETURN 9FT ADLT (ELECTROSURGICAL) ×2
ELECTRODE REM PT RTRN 9FT ADLT (ELECTROSURGICAL) ×1 IMPLANT
EXTRACTOR VACUUM M CUP 4 TUBE (SUCTIONS) IMPLANT
GAUZE SPONGE 4X4 12PLY STRL LF (GAUZE/BANDAGES/DRESSINGS) IMPLANT
GLOVE BIOGEL PI IND STRL 6.5 (GLOVE) ×2 IMPLANT
GLOVE BIOGEL PI INDICATOR 6.5 (GLOVE) ×2
GLOVE SURG SS PI 6.0 STRL IVOR (GLOVE) ×2 IMPLANT
GOWN PREVENTION PLUS LG XLONG (DISPOSABLE) ×4 IMPLANT
KIT ABG SYR 3ML LUER SLIP (SYRINGE) IMPLANT
NEEDLE HYPO 25X5/8 SAFETYGLIDE (NEEDLE) IMPLANT
NS IRRIG 1000ML POUR BTL (IV SOLUTION) ×2 IMPLANT
PACK C SECTION WH (CUSTOM PROCEDURE TRAY) ×2 IMPLANT
PAD ABD 7.5X8 STRL (GAUZE/BANDAGES/DRESSINGS) ×2 IMPLANT
PAD OB MATERNITY 4.3X12.25 (PERSONAL CARE ITEMS) ×2 IMPLANT
RTRCTR C-SECT PINK 25CM LRG (MISCELLANEOUS) IMPLANT
SEPRAFILM MEMBRANE 5X6 (MISCELLANEOUS) IMPLANT
SLEEVE SCD COMPRESS KNEE MED (MISCELLANEOUS) ×2 IMPLANT
STAPLER VISISTAT 35W (STAPLE) ×2 IMPLANT
SUT PLAIN 0 NONE (SUTURE) IMPLANT
SUT PLAIN 2 0 XLH (SUTURE) ×2 IMPLANT
SUT VIC AB 0 CT1 36 (SUTURE) ×8 IMPLANT
SUT VIC AB 4-0 KS 27 (SUTURE) ×2 IMPLANT
TAPE CLOTH SURG 4X10 WHT LF (GAUZE/BANDAGES/DRESSINGS) ×2 IMPLANT
TOWEL OR 17X24 6PK STRL BLUE (TOWEL DISPOSABLE) ×4 IMPLANT
TRAY FOLEY CATH 14FR (SET/KITS/TRAYS/PACK) IMPLANT
WATER STERILE IRR 1000ML POUR (IV SOLUTION) IMPLANT

## 2012-11-16 NOTE — Addendum Note (Signed)
Addendum  created 11/16/12 1713 by Elbert Ewings, CRNA   Modules edited:Notes Section

## 2012-11-16 NOTE — Anesthesia Postprocedure Evaluation (Signed)
  Anesthesia Post-op Note  Patient: Debra Townsend  Procedure(s) Performed: Procedure(s) (LRB) with comments: CESAREAN SECTION (N/A)  Patient Location: PACU and Mother/Baby  Anesthesia Type:Epidural  Level of Consciousness: awake, alert  and oriented  Airway and Oxygen Therapy: Patient Spontanous Breathing  Post-op Pain: mild  Post-op Assessment: Patient's Cardiovascular Status Stable, PATIENT'S CARDIOVASCULAR STATUS UNSTABLE, No signs of Nausea or vomiting, Adequate PO intake, Pain level controlled, No headache, No backache, No residual numbness and No residual motor weakness  Post-op Vital Signs: stable  Complications: No apparent anesthesia complications. Severe itching, spoke to RN who is going to give nubain.

## 2012-11-16 NOTE — Transfer of Care (Signed)
Immediate Anesthesia Transfer of Care Note  Patient: Debra Townsend  Procedure(s) Performed: Procedure(s) (LRB) with comments: CESAREAN SECTION (N/A)  Patient Location: PACU  Anesthesia Type:Epidural  Level of Consciousness: awake, alert , oriented and patient cooperative  Airway & Oxygen Therapy: Patient Spontanous Breathing  Post-op Assessment: Report given to PACU RN and Post -op Vital signs reviewed and stable  Post vital signs: Reviewed and stable  Complications: No apparent anesthesia complications

## 2012-11-16 NOTE — Progress Notes (Signed)
UR chart review completed.  

## 2012-11-16 NOTE — Progress Notes (Signed)
Debra Townsend is a 21 y.o. G3P1011 at [redacted]w[redacted]d by  admitted for induction of labor due to Low amniotic fluid. and Poor fetal growth.,   Subjective: Pt is not comfortable, c/o back pain.  Objective: BP 126/79  Pulse 99  Temp 97.9 F (36.6 C) (Oral)  Resp 20  Ht 5' (1.524 m)  Wt 79.379 kg (175 lb)  BMI 34.18 kg/m2  SpO2 100%      FHT:  FHR: 140 bpm, variability: moderate,  accelerations:  Present,  decelerations:  Present  variable with contractions UC:   regular, every 1-2 minutes MVU 180 SVE:   Dilation: 6 Effacement (%): 100 Station: -1 Exam by:: Dr. Chelsea Aus  Labs: Lab Results  Component Value Date   WBC 10.5 11/15/2012   HGB 9.3* 11/15/2012   HCT 30.2* 11/15/2012   MCV 76.8* 11/15/2012   PLT 168 11/15/2012    Assessment / Plan: Induction of labor due to IUGR and oligohydramnio,  progressing well on pitocin  Labor: Progressing on Pitocin, will continue to increase.  Preeclampsia:  no signs or symptoms of toxicity Fetal Wellbeing:  Category II Pain Control:  Epidural Anticipated MOD:  NSVD AROM with clear liquid IUPC inserted and amnio infusion started for variables.  [name] 11/16/2012, 1:08 AM

## 2012-11-16 NOTE — Progress Notes (Signed)
Patient ID: Debra Townsend, female   DOB: [birth year], 21 y.o.   MRN: [id number] Called to evaluate patient with abnormal tracing. Patient doing well and comfortable with epidural.  Filed Vitals:   11/16/12 0231  BP: 110/69  Pulse: 102  Temp:   Resp: 18   FHT: baseline 130, mod variability, no accels, late decels Toco: contractions q 3-4 min  21 yo G3P1011 at [redacted]w[redacted]d for IOL secondary to IUGR with NRFHT - Discussed option to discontinue pitocin and restart once tracing recovers since cervical change has been made vs proceeding with a repeat cesarean section. Patient opted for repeat cesarean section. Risks, benefits and alternatives were explained including but not limited to risks of bleeding, infection and damage to adjacent organs. Patient verbalized understanding and all questions were answered.

## 2012-11-16 NOTE — Addendum Note (Signed)
Addendum  created 11/16/12 0747 by Renford Dills, CRNA   Modules edited:Notes Section

## 2012-11-16 NOTE — Progress Notes (Signed)
Debra Townsend is a 21 y.o. G3P1011 at [redacted]w[redacted]d by  admitted for induction of labor due to Low amniotic fluid. and Poor fetal growth.,   Subjective: Pt is c/o nausea. Pain in controlled.  Objective: BP 110/69  Pulse 102  Temp 98.1 F (36.7 C) (Oral)  Resp 18  Ht 5' (1.524 m)  Wt 79.379 kg (175 lb)  BMI 34.18 kg/m2  SpO2 100%      FHT:  FHR: 140 bpm, variability: moderate,  accelerations:  Present,  decelerations:  Present  variable with contractions UC:   regular, every 1-2 minutes MVU 180 SVE:   Dilation: 7 Effacement (%): 100 Station:0 Exam by:: Dr. Chelsea Aus  Labs: Lab Results  Component Value Date   WBC 10.5 11/15/2012   HGB 9.3* 11/15/2012   HCT 30.2* 11/15/2012   MCV 76.8* 11/15/2012   PLT 168 11/15/2012    Assessment / Plan: Induction of labor due to IUGR and oligohydramnio,  progressing well on pitocin  Labor: Progressing on Pitocin, will continue to increase.  Preeclampsia:  no signs or symptoms of toxicity Fetal Wellbeing:  Category II Pain Control:  Epidural Anticipated MOD:  NSVD AROM with clear liquid IUPC inserted and amnio infusion started for variables.  Debra Townsend 11/16/2012, 2:43 AM

## 2012-11-16 NOTE — Anesthesia Postprocedure Evaluation (Signed)
  Anesthesia Post-op Note  Patient: Debra Townsend  Procedure(s) Performed: Procedure(s) (LRB) with comments: CESAREAN SECTION (N/A)  Patient Location: Mother/Baby  Anesthesia Type:Epidural  Level of Consciousness: awake  Airway and Oxygen Therapy: Patient Spontanous Breathing  Post-op Pain: mild  Post-op Assessment: Patient's Cardiovascular Status Stable and Respiratory Function Stable  Post-op Vital Signs: stable  Complications: No apparent anesthesia complications

## 2012-11-16 NOTE — Op Note (Signed)
Nickolette Aiken PROCEDURE DATE: 11/14/2012 - 11/16/2012  PREOPERATIVE DIAGNOSIS: Intrauterine pregnancy at  [redacted]w[redacted]d weeks gestation; non-reassuring fetal status  POSTOPERATIVE DIAGNOSIS: The same  PROCEDURE:     Repeat cesarean Section  SURGEON:  Dr. Catalina Antigua  ASSISTANT: none  INDICATIONS: Debra Townsend is a 21 y.o. G3P1011 at [redacted]w[redacted]d admitted for TOLAC secondary to oligo and IUGR for repeat cesarean section secondary to non-reassuring fetal status.  The risks of cesarean section discussed with the patient included but were not limited to: bleeding which may require transfusion or reoperation; infection which may require antibiotics; injury to bowel, bladder, ureters or other surrounding organs; injury to the fetus; need for additional procedures including hysterectomy in the event of a life-threatening hemorrhage; placental abnormalities wth subsequent pregnancies, incisional problems, thromboembolic phenomenon and other postoperative/anesthesia complications. The patient concurred with the proposed plan, giving informed written consent for the procedure.    FINDINGS:  Viable female infant in cephalic presentation.  Apgars 8 and 9, cord gases 7.34.  Clear amniotic fluid.  Intact placenta, three vessel cord.  Adhesion noted between anterior abdominal wall and lower uterine segment. Normal uterus, fallopian tubes and ovaries bilaterally.  ANESTHESIA:    Epidural INTRAVENOUS FLUIDS:2000 ml ESTIMATED BLOOD LOSS: 700 ml URINE OUTPUT:  100 ml SPECIMENS: Placenta sent to pathology COMPLICATIONS: None immediate  PROCEDURE IN DETAIL:  The patient received intravenous antibiotics and had sequential compression devices applied to her lower extremities while in the preoperative area.  She was then taken to the operating room where anesthesia was induced and was found to be adequate. A foley catheter was placed into her bladder and attached to Elior Robinette gravity. She was then placed in a dorsal supine position  with a leftward tilt, and prepped and draped in a sterile manner. After an adequate timeout was performed, a Pfannenstiel skin incision was made with scalpel and carried through to the underlying layer of fascia. The fascia was incised in the midline and this incision was extended bilaterally using the Mayo scissors. Kocher clamps were applied to the superior aspect of the fascial incision and the underlying rectus muscles were dissected off bluntly. A similar process was carried out on the inferior aspect of the facial incision. The rectus muscles were separated in the midline bluntly and the peritoneum was entered bluntly. The Alexis self-retaining retractor was introduced into the abdominal cavity. Attention was turned to the lower uterine segment where a bladder flap was created, and a transverse hysterotomy was made with a scalpel and extended bilaterally bluntly. The infant was successfully delivered, and cord was clamped and cut and infant was handed over to awaiting neonatology team. Uterine massage was then administered and the placenta delivered intact with three-vessel cord. The uterus was cleared of clot and debris.  The hysterotomy was closed with 0 Vicryl in a running locked fashion, and an imbricating layer was also placed with a 0 Vicryl. Overall, excellent hemostasis was noted. The pelvis copiously irrigated and cleared of all clot and debris. Hemostasis was confirmed on all surfaces.  The peritoneum and the muscles were reapproximated using 0 vicryl interrupted stitches. The fascia was then closed using 0 Vicryl in a running locked fashion.  The subcutaneous layer was reapproximated with plain gut and the skin was closed in a subcuticular fashion using 3.0 Vicryl. The patient tolerated the procedure well. Sponge, lap, instrument and needle counts were correct x 2. She was taken to the recovery room in stable condition.    Neythan Kozlov,PEGGYMD  11/16/2012 4:46 AM

## 2012-11-16 NOTE — Anesthesia Postprocedure Evaluation (Signed)
Anesthesia Post Note  Patient: Debra Townsend  Procedure(s) Performed: Procedure(s) (LRB): CESAREAN SECTION (N/A)  Anesthesia type: epidural  Patient location: PACU  Post pain: Pain level controlled  Post assessment: Post-op Vital signs reviewed  Last Vitals:  Filed Vitals:   11/16/12 0500  BP: 133/48  Pulse: 110  Temp: 37 C  Resp: 19    Post vital signs: Reviewed  Level of consciousness: awake  Complications: No apparent anesthesia complications

## 2012-11-17 ENCOUNTER — Encounter (HOSPITAL_COMMUNITY): Payer: Self-pay | Admitting: Obstetrics and Gynecology

## 2012-11-17 LAB — CBC
MCHC: 31 g/dL (ref 30.0–36.0)
MCV: 76.5 fL — ABNORMAL LOW (ref 78.0–100.0)
Platelets: 150 10*3/uL (ref 150–400)
RDW: 15.2 % (ref 11.5–15.5)
WBC: 13.1 10*3/uL — ABNORMAL HIGH (ref 4.0–10.5)

## 2012-11-17 NOTE — Progress Notes (Signed)
Subjective: Postpartum Day 1: Cesarean Delivery Patient reports tolerating PO, + flatus, + BM and no problems voiding.    Objective: Vital signs in last 24 hours: Temp:  [97.7 F (36.5 C)-99.1 F (37.3 C)] 97.7 F (36.5 C) (12/06 0500) Pulse Rate:  [82-101] 89  (12/06 0500) Resp:  [18-24] 18  (12/06 0500) BP: (98-122)/(61-77) 101/66 mmHg (12/06 0500) SpO2:  [96 %-100 %] 99 % (12/06 0500)  Physical Exam:  General: alert, cooperative and no distress Lochia: appropriate Uterine Fundus: firm Incision: no significant drainage DVT Evaluation: No evidence of DVT seen on physical exam.   Basename 11/17/12 0545 11/15/12 1922  HGB 8.5* 9.3*  HCT 27.4* 30.2*    Assessment/Plan: Status post Cesarean section. Doing well postoperatively.  Continue current care. Female, bottle feeding, Depo, circ scheduled at [facility].  Marnee Spring 11/17/2012, 7:35 AM

## 2012-11-17 NOTE — Clinical Social Work Maternal (Signed)
    Clinical Social Work Department PSYCHOSOCIAL ASSESSMENT - MATERNAL/CHILD 11/17/2012  Patient:  Debra Townsend, Debra Townsend  Account Number:  [id number]  Admit Date:  11/14/2012  Marjo Bicker Name:   Toniann Fail    Clinical Social Worker:  Nobie Putnam, LCSW   Date/Time:  11/17/2012 02:57 PM  Date Referred:  11/17/2012   Referral source  CN     Referred reason  Depression/Anxiety  Substance Abuse   Other referral source:    I:  FAMILY / HOME ENVIRONMENT Child's legal guardian:  PARENT  Guardian - Name Guardian - Age Guardian - Address  Alayza Pieper [address]; [place], [place] [address]  Marjie Skiff 22 (same as above)   Other household support members/support persons Name Relationship DOB  Hildegarde Dunaway [facility] 05/2011   Other support:   Sister    II  PSYCHOSOCIAL DATA Information Source:  Patient Interview  Event organiser Employment:   Surveyor, quantity resources:  Media planner If Medicaid - [place]:  [employer] Other  Sales executive  WIC   School / Grade:   Maternity Care Coordinator / Child Services Coordination / Early Interventions:  Cultural issues impacting care:    III  STRENGTHS Strengths  Adequate Resources  Home prepared for Child (including basic supplies)  Supportive family/friends   Strength comment:    IV  RISK FACTORS AND CURRENT PROBLEMS Current Problem:  YES   Risk Factor & Current Problem Patient Issue Family Issue Risk Factor / Current Problem Comment  Mental Illness Y N Hx of depression  Substance Abuse Y N Hx of MJ use    V  SOCIAL WORK ASSESSMENT Pt admits to smoking MJ, "2 times out the year" prior to pregnancy confirmation.  Pt explained that she never smoked regularly.  She denies other illegal substance use verbalized understanding of hospital drug testing policy. UDS collection is pending, as well as meconium results.  Pt didn't seem worried about results of test, as she expressed confidence results would be negative.  Pt told CSW that  she was never diagnosed with depression but felt depressed a month ago.  Pt told CSW that she has too much going on and felt overwhelmed with school & sick family members.  Pt states she was stressed at the time but no longer feels that way.  She reports feeling "fine now."  She identified FOB as her primary support person, as well as her sister. She has all the necessary supplies for the infant.  Pt appears to be appropriate.  CSW will continue to monitor drug screen results and make a referral if needed.      VI SOCIAL WORK PLAN Social Work Plan  No Further Intervention Required / No Barriers to Discharge   Type of pt/family education:   If child protective services report - county:   If child protective services report - date:   Information/referral to community resources comment:   Other social work plan:

## 2012-11-17 NOTE — Progress Notes (Signed)
I have seen the patient with the resident/student and agree with the above.   

## 2012-11-18 MED ORDER — OXYCODONE-ACETAMINOPHEN 5-325 MG PO TABS: 1.0000 | ORAL_TABLET | ORAL | Status: DC | PRN

## 2012-11-18 MED ORDER — IBUPROFEN 600 MG PO TABS: 600.0000 mg | ORAL_TABLET | Freq: Four times a day (QID) | ORAL | Status: DC

## 2012-11-18 NOTE — Discharge Summary (Signed)
Obstetric Discharge Summary Reason for Admission: induction of labor for IUGR and oligo; pt desired a VBAC but due to non-reassuring fetal status, a cesarean section was indicated Napoleon Form, MD Physician Signed Obstetrics H&P 11/14/2012 5:55 PM   Debra Townsend is a 21 y.o. female [redacted]w[redacted]d at [redacted]w[redacted]d by LMP consistent with 15w ultrasound, admitted for IOL for IUGR <3%ile with oligohydramnios (AFI 4.59); no growth noted on ultrasound since 11/20. Presentation is vertex. FHR wnl by Doppler in clinic. Previous pregnancy was a c-section for nonreassuring fetal heart tones at 36w, in 2012. Pt has signed consent for TOLAC though she has some questions regarding relative safety of c-section vs TOLAC for baby given IUGR.  Pt reports no bleeding or definite LOF; pt does have oligo and reports perhaps some trickling of fluids (states she has had a few times where she has urinated and thought she was finished, with some continued feeling of fluids after 'finishing'). Some reduced fetal movements over the past week compared to previously, but still feeling movements. Reports no headaches or change in vision (blurriness, double vision, spots/flashes), no RUQ pain; does endorse some blurred vision earlier in pregnancy and some last week. Reports no new/worse swelling/edema. No fever/chills, N/V.  Pt was seen for prenatal care by [facility]. BP elevated in clinic today, 140/80; pt with persistent 1+ protein on UA's throughout pregnancy.  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 Pt was positive for Chlamydia on 11/13 and received treatment; test of cure pending, specimen collected in clinic today.  Hx UDS+ for THC during pregnancy.  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  Maternal Medical History:  Reason for admission: Here for IOL for oligohydramnios, IUGR <3%ile    Prenatal Procedures: none Intrapartum Procedures: cesarean: low cervical, transverseFINDINGS: Viable female infant in cephalic  presentation. Apgars 8 and 9, cord gases 7.34. Clear amniotic fluid. Intact placenta, three vessel cord. Adhesion noted between anterior abdominal wall and lower uterine segment. Normal uterus, fallopian tubes and ovaries bilaterally.     Postpartum Procedures: none Complications-Operative and Postpartum: none Hemoglobin  Date Value Range Status  11/17/2012 8.5* 12.0 - 15.0 g/dL Final     HCT  Date Value Range Status  11/17/2012 27.4* 36.0 - 46.0 % Final    Physical Exam:  General: alert, cooperative and mild distress Lochia: appropriate Uterine Fundus: firm Incision: healing well, no significant drainage, no significant erythema DVT Evaluation: No evidence of DVT seen on physical exam.  Discharge Diagnoses: Term Pregnancy-delivered  Discharge Information: Date: 11/18/2012 Activity: pelvic rest Diet: routine Medications: PNV, Ibuprofen and Percocet Condition: stable Instructions: refer to practice specific booklet Discharge to: home Follow-up Information    Follow up with [facility]. (Make a postpartum appointment for 4-6 weeks)    Contact information:   [address] [place] [address]          Newborn Data: Live born female  Birth Weight: 6 lb 10.7 oz (3025 g) APGAR: 8, 9  Home with mother. Bottlefeeding. Plans on getting Depo at [facility] and then possibly Nexplanon.  Cam Hai 11/18/2012, 7:36 AM

## 2012-11-21 ENCOUNTER — Inpatient Hospital Stay (HOSPITAL_COMMUNITY)
Admission: AD | Admit: 2012-11-21 | Discharge: 2012-11-23 | DRG: 776 | Disposition: A | Discharge status: Home / Self Care | Payer: Medicaid Other | Source: Ambulatory Visit | Attending: Family Medicine | Admitting: Family Medicine

## 2012-11-21 ENCOUNTER — Telehealth (HOSPITAL_COMMUNITY): Payer: Self-pay | Admitting: *Deleted

## 2012-11-21 DIAGNOSIS — O909 Complication of the puerperium, unspecified: Secondary | ICD-10-CM | POA: Diagnosis present

## 2012-11-21 DIAGNOSIS — IMO0002 Reserved for concepts with insufficient information to code with codable children: Principal | ICD-10-CM | POA: Diagnosis present

## 2012-11-21 LAB — CBC
HCT: 31.4 % — ABNORMAL LOW (ref 36.0–46.0)
Hemoglobin: 9.7 g/dL — ABNORMAL LOW (ref 12.0–15.0)
MCV: 76.2 fL — ABNORMAL LOW (ref 78.0–100.0)
RDW: 15.2 % (ref 11.5–15.5)
WBC: 8.5 10*3/uL (ref 4.0–10.5)

## 2012-11-21 LAB — COMPREHENSIVE METABOLIC PANEL
Albumin: 2.4 g/dL — ABNORMAL LOW (ref 3.5–5.2)
Alkaline Phosphatase: 163 U/L — ABNORMAL HIGH (ref 39–117)
BUN: 10 mg/dL (ref 6–23)
Calcium: 8.9 mg/dL (ref 8.4–10.5)
Potassium: 4.3 mEq/L (ref 3.5–5.1)
Sodium: 138 mEq/L (ref 135–145)
Total Protein: 6.8 g/dL (ref 6.0–8.3)

## 2012-11-21 MED ORDER — MAGNESIUM SULFATE BOLUS VIA INFUSION
4.0000 g | Freq: Once | INTRAVENOUS | Status: AC
  Administered 2012-11-21: 4 g via INTRAVENOUS
  Filled 2012-11-21: qty 500

## 2012-11-21 MED ORDER — MAGNESIUM SULFATE 40 G IN LACTATED RINGERS - SIMPLE
2.0000 g/h | INTRAVENOUS | Status: AC
  Filled 2012-11-21: qty 500

## 2012-11-21 MED ORDER — ALUM & MAG HYDROXIDE-SIMETH 200-200-20 MG/5ML PO SUSP
30.0000 mL | ORAL | Status: DC | PRN
  Filled 2012-11-21: qty 30

## 2012-11-21 MED ORDER — OXYCODONE-ACETAMINOPHEN 5-325 MG PO TABS
1.0000 | ORAL_TABLET | ORAL | Status: DC | PRN
  Administered 2012-11-22: 1 via ORAL
  Filled 2012-11-21: qty 1

## 2012-11-21 MED ORDER — LACTATED RINGERS IV SOLN
INTRAVENOUS | Status: DC
  Administered 2012-11-21: 21:00:00 via INTRAVENOUS

## 2012-11-21 MED ORDER — GUAIFENESIN 100 MG/5ML PO SOLN: 15.0000 mL | ORAL | Status: DC | PRN

## 2012-11-21 MED ORDER — MENTHOL 3 MG MT LOZG: 1.0000 | LOZENGE | OROMUCOSAL | Status: DC | PRN

## 2012-11-21 MED ORDER — TEMAZEPAM 15 MG PO CAPS: 15.0000 mg | ORAL_CAPSULE | Freq: Every evening | ORAL | Status: DC | PRN

## 2012-11-21 MED ORDER — MAGNESIUM SULFATE 40 G IN LACTATED RINGERS - SIMPLE
2.0000 g/h | INTRAVENOUS | Status: DC
  Administered 2012-11-21: 2 g/h via INTRAVENOUS
  Filled 2012-11-21: qty 500

## 2012-11-21 NOTE — H&P (Signed)
Debra Townsend is an 21 y.o. [phone] [redacted]w[redacted]d female.    Chief Complaint: Headache, elevated BP  HPI: Referred from [facility] today where seen for an incision check.  She is 5 days s/p RLTCS after failed TOLAC.  She had proteinuria, blurred vision, headache and BP was 142/102 was found to have brisk reflexes and proteinuria.  She was sent down for admission and treatment with Magnesium sulfate.  Past Medical History  Diagnosis Date  . Urinary tract infection   . Asthma     childhood  . Anxiety   . Depression     No treatment, being monitored by Dr. Emelda Fear  . Chlamydia     Past Surgical History  Procedure Date  . Therapeutic abortion   . Cesarean section   . Cesarean section 11/16/2012    Procedure: CESAREAN SECTION;  Surgeon: Catalina Antigua, MD;  Location: [facility];  Service: Obstetrics;  Laterality: N/A;    Family History  Problem Relation Age of Onset  . Other Neg Hx   HTN in both parents.  Social History:  reports that she has quit smoking. Her smoking use included Cigarettes. She has never used smokeless tobacco. She reports that she uses illicit drugs (Marijuana). She reports that she does not drink alcohol.  Allergies:  Allergies  Allergen Reactions  . Other Hives    Peaches   . Penicillins Hives    Medications Prior to Admission  Medication Sig Dispense Refill  . ibuprofen (ADVIL,MOTRIN) 600 MG tablet Take 1 tablet (600 mg total) by mouth every 6 (six) hours.  50 tablet  1  . oxyCODONE-acetaminophen (PERCOCET/ROXICET) 5-325 MG per tablet Take 1-2 tablets by mouth every 4 (four) hours as needed (moderate - severe pain).  50 tablet  0   OB History    Grav Para Term Preterm Abortions TAB SAB Ect Mult Living   3 2 2  0 1 1 0 0  2        Constitutional: positive for headahce, blurred vision Eyes: positive for visual disturbance Respiratory: negative for cough and dyspnea on exertion Cardiovascular: positive for palpitations Gastrointestinal: negative for  constipation, diarrhea, nausea and vomiting Genitourinary:negative Hematologic/lymphatic: negative Neurological: positive for headaches Behavioral/Psych: negative Endocrine: negative  Blood pressure 138/88, pulse 83, temperature 98.5 F (36.9 C), temperature source Oral, resp. rate 20, SpO2 100.00%, unknown if currently breastfeeding. BP 138/88  Pulse 83  Temp 98.5 F (36.9 C) (Oral)  Resp 20  SpO2 100%  Breastfeeding? Unknown General appearance: alert, cooperative and appears stated age Head: Normocephalic, without obvious abnormality, atraumatic Neck: no adenopathy and supple, symmetrical, trachea midline Lungs: clear to auscultation bilaterally Heart: regular rate and rhythm, S1, S2 normal, no murmur, click, rub or gallop Abdomen: soft, non-tender; bowel sounds normal; no masses,  no organomegaly Extremities: edema trace Pulses: 2+ and symmetric Skin: Skin color, texture, turgor normal. No rashes or lesions or incision is well approximated and without erythema. Neurologic: Reflexes: 2+ and symmetric quadriceps reflex (L-2 to L-4) right and left 3-4+/4 No clonus   Lab Results  Component Value Date   WBC 13.1* 11/17/2012   HGB 8.5* 11/17/2012   HCT 27.4* 11/17/2012   MCV 76.5* 11/17/2012   PLT 150 11/17/2012   No results found for this basename: PREGTESTUR, PREGSERUM, HCG, HCGQUANT     Assessment/Plan Patient Active Problem List  Diagnosis  . Mild or unspecified pre-eclampsia, postpartum condition or complication   Admit for serial BP Labs 24 hours of Magnesium sulfate for seizure prophylaxis.  Nasire Reali S 11/21/2012, 7:51 PM

## 2012-11-21 NOTE — Progress Notes (Signed)
Pt arrived from home for IV magnesium tx, admitted to Rm #[id number]. Dr Shawnie Pons notified of pts arrival - orders needed.

## 2012-11-21 NOTE — Telephone Encounter (Signed)
Resolve episode 

## 2012-11-22 ENCOUNTER — Encounter (HOSPITAL_COMMUNITY): Payer: Self-pay | Admitting: *Deleted

## 2012-11-22 DIAGNOSIS — IMO0002 Reserved for concepts with insufficient information to code with codable children: Secondary | ICD-10-CM

## 2012-11-22 DIAGNOSIS — O909 Complication of the puerperium, unspecified: Secondary | ICD-10-CM

## 2012-11-22 MED ORDER — IBUPROFEN 600 MG PO TABS
600.0000 mg | ORAL_TABLET | Freq: Four times a day (QID) | ORAL | Status: DC | PRN
  Administered 2012-11-22: 600 mg via ORAL
  Filled 2012-11-22: qty 1

## 2012-11-22 NOTE — Progress Notes (Signed)
Patient ID: Zyriah Mask, female   DOB: 10-Sep-1991, 21 y.o.   MRN: [id number]   LOS:  Day 2 Admitted 11/21/12  Admission Diagnosis:  Post-Partum Preeclampsia  S:  Patient denies headache, vision changes, RUQ pain today. Voiding well. Swelling improved. No complaints.   O: Filed Vitals:   11/22/12 0500 11/22/12 0600 11/22/12 0632 11/22/12 0700  BP: 121/72 117/78  124/83  Pulse: 86 84 80 82  Temp:      TempSrc:      Resp:   18   Height:      Weight:      SpO2: 99% 99% 99% 97%    Intake/Output Summary (Last 24 hours) at 11/22/12 0742 Last data filed at 11/22/12 0700  Gross per 24 hour  Intake 1720.83 ml  Output   2200 ml  Net -479.17 ml    GEN:  Alert, no distress HEENT: NCAT, EOMI, conjunctiva normal CV: RRR, no murmur RESP:  CTAB ABD: Soft, non-tender, no guarding/rebound. Incision healing well, clean, dry, intact, steri-strips still on EXTREM:  Minimal lower extremity edema, no calf tenderness NEURO: DTRs 1+, alert and oriented  Results for orders placed during the hospital encounter of 11/21/12 (from the past 24 hour(s))  MRSA PCR SCREENING     Status: Normal   Collection Time   11/21/12  6:00 PM      Component Value Range   MRSA by PCR NEGATIVE  NEGATIVE  COMPREHENSIVE METABOLIC PANEL     Status: Abnormal   Collection Time   11/21/12  8:10 PM      Component Value Range   Sodium 138  135 - 145 mEq/L   Potassium 4.3  3.5 - 5.1 mEq/L   Chloride 104  96 - 112 mEq/L   CO2 24  19 - 32 mEq/L   Glucose, Bld 88  70 - 99 mg/dL   BUN 10  6 - 23 mg/dL   Creatinine, Ser 4.09  0.50 - 1.10 mg/dL   Calcium 8.9  8.4 - 81.1 mg/dL   Total Protein 6.8  6.0 - 8.3 g/dL   Albumin 2.4 (*) 3.5 - 5.2 g/dL   AST 16  0 - 37 U/L   ALT 14  0 - 35 U/L   Alkaline Phosphatase 163 (*) 39 - 117 U/L   Total Bilirubin 0.2 (*) 0.3 - 1.2 mg/dL   GFR calc non Af Amer >90  >90 mL/min   GFR calc Af Amer >90  >90 mL/min  CBC     Status: Abnormal   Collection Time   11/21/12  8:10 PM   Component Value Range   WBC 8.5  4.0 - 10.5 K/uL   RBC 4.12  3.87 - 5.11 MIL/uL   Hemoglobin 9.7 (*) 12.0 - 15.0 g/dL   HCT 91.4 (*) 78.2 - 95.6 %   MCV 76.2 (*) 78.0 - 100.0 fL   MCH 23.5 (*) 26.0 - 34.0 pg   MCHC 30.9  30.0 - 36.0 g/dL   RDW 21.3  08.6 - 57.8 %   Platelets 296  150 - 400 K/uL    A/P 21 y.o. [id number] on POD #6 after c/section with PP Preeclampsia - Magnesium off at 2000 - BP controlled - LFTs and Platelets normal - symptoms resolved  Napoleon Form, MD 11/22/2012 7:50 AM

## 2012-11-22 NOTE — Progress Notes (Signed)
UR chart review completed.  

## 2012-11-22 NOTE — Consult Note (Signed)
Initial consult with this mom, readmitted to the hospital, and in the AICU. She is formula feeding, and is engorged. I spoke to mom briefly. She tried pumping and bottle feeding with her first, and did not like it. She said latching hurt. I told her with the proper help, latching will not hurt. She knows that if she changes her mind, she can call lactation. In the meantime, I asked Marchelle Folks, nurse tech, to call the kitchen for cabbage leaves.

## 2012-11-23 NOTE — Progress Notes (Signed)
Pt discharged to home with mother.  Condition stable.  Pt ambulated to car with RN.  No equipment for home ordered at discharge. 

## 2012-11-23 NOTE — Discharge Summary (Signed)
Obstetric Discharge Summary Reason for Admission: Postpartum hypertension one week after her cesarean Prenatal Procedures:  Intrapartum Procedures:  Postpartum Procedures: Admission for 24 hours of magnesium sulfate Complications-Operative and Postpartum: none Hemoglobin  Date Value Range Status  11/21/2012 9.7* 12.0 - 15.0 g/dL Final     HCT  Date Value Range Status  11/21/2012 31.4* 36.0 - 46.0 % Final    Physical Exam:  General: alert, cooperative and no distress Lochia: appropriate Uterine Fundus: firm Incision: healing well, no significant drainage DVT Evaluation: No evidence of DVT seen on physical exam.  Discharge Diagnoses: Preelampsia and Postpartum  Discharge Information: Date: 11/23/2012 Activity: Take fluid pill daily followup 1 week [facility] Diet: routine Medications: Ibuprofen and hctz 25 daily Condition: stable Instructions: refer to practice specific booklet Discharge to: home Follow-up Information    Follow up with [facility]. Call in 1 week. (Or earlier as needed if symptoms worsen)    Contact information:   [address] [place] [address] [phone]         Newborn Data: This patient has no babies on file. Home with mother.  Rex Oesterle V 11/23/2012, 9:13 AM

## 2012-11-23 NOTE — Discharge Summary (Signed)
Subjective: Postpartum Day 8: Cesarean Delivery, Hosp Day 2 s/p readmit for Postpartum HTN Patient reports mild headache at times , comes and goes, no ruq pain, lochia wnl  Objective: Vital signs in last 24 hours: Temp:  [98.3 F (36.8 C)-98.6 F (37 C)] 98.5 F (36.9 C) (12/12 0400) Pulse Rate:  [78-96] 79  (12/12 0400) Resp:  [16-18] 16  (12/12 0400) BP: (114-134)/(72-91) 134/81 mmHg (12/12 0400) SpO2:  [97 %-100 %] 100 % (12/12 0400) Weight:  [169 lb 8 oz (76.885 kg)] 169 lb 8 oz (76.885 kg) (12/12 0400)  Physical Exam:  General: alert, cooperative and no distress Lochia: appropriate Uterine Fundus: firm Incision: healing well DVT Evaluation: No evidence of DVT seen on physical exam.   Basename 11/21/12 2010  HGB 9.7*  HCT 31.4*    Assessment/Plan:s/p postpartum readmit for pp htn, treated with mag sulfate x 24 more hours Status post Cesarean section. Postoperative course complicated by pp htn, now recovered  Discharge home with standard precautions and return to clinic in 1 weeks. Rx HCTZ 25 q day x 10 days.  Bottle feeding, desires Depo at followup. Dorsey Charette V 11/23/2012, 9:03 AM

## 2013-02-20 ENCOUNTER — Encounter: Payer: Self-pay | Admitting: *Deleted

## 2013-02-20 DIAGNOSIS — A749 Chlamydial infection, unspecified: Secondary | ICD-10-CM | POA: Insufficient documentation

## 2013-02-20 DIAGNOSIS — B009 Herpesviral infection, unspecified: Secondary | ICD-10-CM

## 2013-02-27 ENCOUNTER — Ambulatory Visit: Payer: Self-pay

## 2013-03-14 ENCOUNTER — Other Ambulatory Visit: Payer: Medicaid Other

## 2013-03-14 ENCOUNTER — Encounter: Payer: Self-pay | Admitting: Obstetrics & Gynecology

## 2013-03-14 ENCOUNTER — Ambulatory Visit (INDEPENDENT_AMBULATORY_CARE_PROVIDER_SITE_OTHER): Payer: Medicaid Other | Admitting: Obstetrics & Gynecology

## 2013-03-14 VITALS — BP 120/78 | Wt 170.5 lb

## 2013-03-14 DIAGNOSIS — Z309 Encounter for contraceptive management, unspecified: Secondary | ICD-10-CM

## 2013-03-14 DIAGNOSIS — Z3049 Encounter for surveillance of other contraceptives: Secondary | ICD-10-CM

## 2013-03-14 DIAGNOSIS — IMO0001 Reserved for inherently not codable concepts without codable children: Secondary | ICD-10-CM

## 2013-03-14 MED ORDER — MEDROXYPROGESTERONE ACETATE 150 MG/ML IM SUSP
150.0000 mg | Freq: Once | INTRAMUSCULAR | Status: AC
  Administered 2013-03-14: 150 mg via INTRAMUSCULAR

## 2013-06-06 ENCOUNTER — Ambulatory Visit: Payer: Self-pay

## 2013-06-07 ENCOUNTER — Encounter: Payer: Self-pay | Admitting: Adult Health

## 2013-06-07 ENCOUNTER — Ambulatory Visit (INDEPENDENT_AMBULATORY_CARE_PROVIDER_SITE_OTHER): Payer: Medicaid Other | Admitting: Adult Health

## 2013-06-07 VITALS — BP 122/68 | Ht 60.0 in | Wt 179.0 lb

## 2013-06-07 DIAGNOSIS — Z3049 Encounter for surveillance of other contraceptives: Secondary | ICD-10-CM

## 2013-06-07 DIAGNOSIS — Z3200 Encounter for pregnancy test, result unknown: Secondary | ICD-10-CM

## 2013-06-07 DIAGNOSIS — Z309 Encounter for contraceptive management, unspecified: Secondary | ICD-10-CM

## 2013-06-07 DIAGNOSIS — Z3202 Encounter for pregnancy test, result negative: Secondary | ICD-10-CM

## 2013-06-07 LAB — POCT URINE PREGNANCY: Preg Test, Ur: NEGATIVE

## 2013-06-07 MED ORDER — MEDROXYPROGESTERONE ACETATE 150 MG/ML IM SUSP
150.0000 mg | Freq: Once | INTRAMUSCULAR | Status: AC
  Administered 2013-06-07: 150 mg via INTRAMUSCULAR

## 2013-08-30 ENCOUNTER — Encounter: Payer: Self-pay | Admitting: *Deleted

## 2013-08-30 ENCOUNTER — Ambulatory Visit: Payer: Medicaid Other

## 2013-12-27 ENCOUNTER — Encounter (INDEPENDENT_AMBULATORY_CARE_PROVIDER_SITE_OTHER): Payer: Self-pay

## 2013-12-27 ENCOUNTER — Other Ambulatory Visit: Payer: Self-pay | Admitting: Obstetrics & Gynecology

## 2013-12-27 ENCOUNTER — Other Ambulatory Visit: Payer: Medicaid Other

## 2013-12-27 ENCOUNTER — Ambulatory Visit (INDEPENDENT_AMBULATORY_CARE_PROVIDER_SITE_OTHER): Payer: Medicaid Other

## 2013-12-27 DIAGNOSIS — O34219 Maternal care for unspecified type scar from previous cesarean delivery: Secondary | ICD-10-CM

## 2013-12-27 DIAGNOSIS — O3680X0 Pregnancy with inconclusive fetal viability, not applicable or unspecified: Secondary | ICD-10-CM

## 2013-12-27 DIAGNOSIS — O200 Threatened abortion: Secondary | ICD-10-CM

## 2013-12-28 ENCOUNTER — Telehealth: Payer: Self-pay | Admitting: Adult Health

## 2013-12-28 LAB — HCG, QUANTITATIVE, PREGNANCY: HCG, BETA CHAIN, QUANT, S: 3571.5 m[IU]/mL

## 2013-12-28 NOTE — Telephone Encounter (Signed)
Left message to call back  

## 2013-12-31 ENCOUNTER — Other Ambulatory Visit: Payer: Self-pay | Admitting: Obstetrics & Gynecology

## 2013-12-31 DIAGNOSIS — O200 Threatened abortion: Secondary | ICD-10-CM

## 2014-01-03 ENCOUNTER — Encounter: Payer: Self-pay | Admitting: Adult Health

## 2014-01-03 ENCOUNTER — Ambulatory Visit (INDEPENDENT_AMBULATORY_CARE_PROVIDER_SITE_OTHER): Payer: Medicaid Other | Admitting: Adult Health

## 2014-01-03 ENCOUNTER — Ambulatory Visit (INDEPENDENT_AMBULATORY_CARE_PROVIDER_SITE_OTHER): Payer: Medicaid Other

## 2014-01-03 VITALS — BP 110/66 | Ht 60.0 in | Wt 180.0 lb

## 2014-01-03 DIAGNOSIS — O98519 Other viral diseases complicating pregnancy, unspecified trimester: Secondary | ICD-10-CM

## 2014-01-03 DIAGNOSIS — O200 Threatened abortion: Secondary | ICD-10-CM

## 2014-01-03 DIAGNOSIS — O9989 Other specified diseases and conditions complicating pregnancy, childbirth and the puerperium: Secondary | ICD-10-CM

## 2014-01-03 DIAGNOSIS — R071 Chest pain on breathing: Secondary | ICD-10-CM

## 2014-01-03 DIAGNOSIS — R0789 Other chest pain: Secondary | ICD-10-CM

## 2014-01-03 DIAGNOSIS — Z3490 Encounter for supervision of normal pregnancy, unspecified, unspecified trimester: Secondary | ICD-10-CM

## 2014-01-03 HISTORY — DX: Encounter for supervision of normal pregnancy, unspecified, unspecified trimester: Z34.90

## 2014-01-03 HISTORY — DX: Other chest pain: R07.89

## 2014-01-03 MED ORDER — PRENATAL PLUS 27-1 MG PO TABS: 1.0000 | ORAL_TABLET | Freq: Every day | ORAL | Status: DC

## 2014-01-03 NOTE — Patient Instructions (Signed)
Costochondritis Costochondritis, sometimes called Tietze syndrome, is a swelling and irritation (inflammation) of the tissue (cartilage) that connects your ribs with your breastbone (sternum). It causes pain in the chest and rib area. Costochondritis usually goes away on its own over time. It can take up to 6 weeks or longer to get better, especially if you are unable to limit your activities. CAUSES  Some cases of costochondritis have no known cause. Possible causes include:  Injury (trauma).  Exercise or activity such as lifting.  Severe coughing. SIGNS AND SYMPTOMS  Pain and tenderness in the chest and rib area.  Pain that gets worse when coughing or taking deep breaths.  Pain that gets worse with specific movements. DIAGNOSIS  Your health care provider will do a physical exam and ask about your symptoms. Chest X-rays or other tests may be done to rule out other problems. TREATMENT  Costochondritis usually goes away on its own over time. Your health care provider may prescribe medicine to help relieve pain. HOME CARE INSTRUCTIONS   Avoid exhausting physical activity. Try not to strain your ribs during normal activity. This would include any activities using chest, abdominal, and side muscles, especially if heavy weights are used.  Apply ice to the affected area for the first 2 days after the pain begins.  Put ice in a plastic bag.  Place a towel between your skin and the bag.  Leave the ice on for 20 minutes, 2 3 times a day.  Only take over-the-counter or prescription medicines as directed by your health care provider. SEEK MEDICAL CARE IF:  You have redness or swelling at the rib joints. These are signs of infection.  Your pain does not go away despite rest or medicine. SEEK IMMEDIATE MEDICAL CARE IF:   Your pain increases or you are very uncomfortable.  You have shortness of breath or difficulty breathing.  You cough up blood.  You have worse chest pains,  sweating, or vomiting.  You have a fever or persistent symptoms for more than 2 3 days.  You have a fever and your symptoms suddenly get worse. MAKE SURE YOU:   Understand these instructions.  Will watch your condition.  Will get help right away if you are not doing well or get worse. Document Released: 09/08/2005 Document Revised: 09/19/2013 Document Reviewed: 07/03/2013 [facility] Patient Information 2014 [place], [place]. Take tylenol can take benadryl if anxious Follow up in 2 weeks for US Call bleeding

## 2014-01-03 NOTE — Progress Notes (Signed)
Subjective:     Patient ID: Gean QuintCarissa Bazen, female   DOB: 06-09-1991, 23 y.o.   MRN: [id number]  HPI Charleston RopesCarrissa is a 23 year old black female in for US for growth, complains of chest pain, and grandma had stroke in ICU.Pt says she got pregnant on depo.  Review of Systems See HPI Reviewed past medical,surgical, social and family history. Reviewed medications and allergies.     Objective:   Physical Exam BP 110/66  Ht 5' (1.524 m)  Wt 180 lb (81.647 kg)  BMI 35.15 kg/m2   US shows IUP with fetal pole and FHR 89, has tenderness if sternum palpated and when moves to change positions.discussed with Dr Despina HiddenEure.  Assessment:     Pregnant early costochondral chest pain    Plan:     Use tylenol ,use ice and heat rx prenatal plus #30 1 daily with 11 refills Follow up in 2 weeks for US and see me   If gets anxious can take benadryl  Review handout on costochondritis Call prn problems

## 2014-01-03 NOTE — Progress Notes (Signed)
U/S-single IUP with +FCA noted, FHR-89bpm, CRL c/w 5+5 wks, EDD 08/31/2014, cx appears long and closed, bilateral adnexa wnl, small amount of free fluid noted within posterior cul-de-sac

## 2014-01-17 ENCOUNTER — Encounter: Payer: Self-pay | Admitting: Adult Health

## 2014-01-17 ENCOUNTER — Other Ambulatory Visit: Payer: Self-pay

## 2014-01-18 ENCOUNTER — Other Ambulatory Visit: Payer: Self-pay

## 2014-01-25 ENCOUNTER — Ambulatory Visit (INDEPENDENT_AMBULATORY_CARE_PROVIDER_SITE_OTHER): Payer: Medicaid Other

## 2014-01-25 ENCOUNTER — Other Ambulatory Visit: Payer: Self-pay | Admitting: Adult Health

## 2014-01-25 DIAGNOSIS — O021 Missed abortion: Secondary | ICD-10-CM

## 2014-01-25 DIAGNOSIS — Z3490 Encounter for supervision of normal pregnancy, unspecified, unspecified trimester: Secondary | ICD-10-CM

## 2014-01-25 NOTE — Progress Notes (Signed)
U/S(8+6wks)-single IUP with NO FCA NOTED today, CRL c/w 6+5wks, cx appears long and closed, bilateral adnexa appears wnl, Jennifer Griffin,NP observed u/s

## 2014-02-01 ENCOUNTER — Encounter: Payer: Self-pay | Admitting: Adult Health

## 2014-02-01 ENCOUNTER — Other Ambulatory Visit (INDEPENDENT_AMBULATORY_CARE_PROVIDER_SITE_OTHER): Payer: Medicaid Other

## 2014-02-01 ENCOUNTER — Other Ambulatory Visit: Payer: Self-pay | Admitting: Obstetrics & Gynecology

## 2014-02-01 ENCOUNTER — Ambulatory Visit (INDEPENDENT_AMBULATORY_CARE_PROVIDER_SITE_OTHER): Payer: Medicaid Other | Admitting: Adult Health

## 2014-02-01 VITALS — BP 120/80 | Ht 60.0 in | Wt 183.0 lb

## 2014-02-01 DIAGNOSIS — O3680X0 Pregnancy with inconclusive fetal viability, not applicable or unspecified: Secondary | ICD-10-CM

## 2014-02-01 DIAGNOSIS — O039 Complete or unspecified spontaneous abortion without complication: Secondary | ICD-10-CM

## 2014-02-01 HISTORY — DX: Complete or unspecified spontaneous abortion without complication: O03.9

## 2014-02-01 NOTE — Progress Notes (Signed)
Subjective:     Patient ID: Debra QuintCarissa Townsend, female   DOB: 1991-08-26, 23 y.o.   MRN: [id number]  HPI Debra SpringCarissa is back for US for growth and check FCA.  Review of Systems See HPI Reviewed past medical,surgical, social and family history. Reviewed medications and allergies.      Objective:   Physical Exam BP 120/80  Ht 5' (1.524 m)  Wt 183 lb (83.008 kg)  BMI 35.74 kg/m2   Reviewed US with pt, no FCA no growth, this is miscarriage, will follow for now, but discussed no need for D&C but could rx cytotec if desired,Pt is B+.Pt will follow for now.  Assessment:     Miscarriage    Plan:    Follow up in 10 days Review handout on miscarriage Call prn problems

## 2014-02-01 NOTE — Progress Notes (Signed)
Transvaginal US performed today for follow up viability.  No FHTs noted today and no growth of fetal pole since prior us.  Fetal demise.

## 2014-02-01 NOTE — Patient Instructions (Signed)
Miscarriage A miscarriage is the sudden loss of an unborn baby (fetus) before the 20th week of pregnancy. Most miscarriages happen in the first 3 months of pregnancy. Sometimes, it happens before a woman even knows she is pregnant. A miscarriage is also called a "spontaneous miscarriage" or "early pregnancy loss." Having a miscarriage can be an emotional experience. Talk with your caregiver about any questions you may have about miscarrying, the grieving process, and your future pregnancy plans. CAUSES   Problems with the fetal chromosomes that make it impossible for the baby to develop normally. Problems with the baby's genes or chromosomes are most often the result of errors that occur, by chance, as the embryo divides and grows. The problems are not inherited from the parents.  Infection of the cervix or uterus.   Hormone problems.   Problems with the cervix, such as having an incompetent cervix. This is when the tissue in the cervix is not strong enough to hold the pregnancy.   Problems with the uterus, such as an abnormally shaped uterus, uterine fibroids, or congenital abnormalities.   Certain medical conditions.   Smoking, drinking alcohol, or taking illegal drugs.   Trauma.  Often, the cause of a miscarriage is unknown.  SYMPTOMS   Vaginal bleeding or spotting, with or without cramps or pain.  Pain or cramping in the abdomen or lower back.  Passing fluid, tissue, or blood clots from the vagina. DIAGNOSIS  Your caregiver will perform a physical exam. You may also have an ultrasound to confirm the miscarriage. Blood or urine tests may also be ordered. TREATMENT   Sometimes, treatment is not necessary if you naturally pass all the fetal tissue that was in the uterus. If some of the fetus or placenta remains in the body (incomplete miscarriage), tissue left behind may become infected and must be removed. Usually, a dilation and curettage (D and C) procedure is performed.  During a D and C procedure, the cervix is widened (dilated) and any remaining fetal or placental tissue is gently removed from the uterus.  Antibiotic medicines are prescribed if there is an infection. Other medicines may be given to reduce the size of the uterus (contract) if there is a lot of bleeding.  If you have Rh negative blood and your baby was Rh positive, you will need a Rh immunoglobulin shot. This shot will protect any future baby from having Rh blood problems in future pregnancies. HOME CARE INSTRUCTIONS   Your caregiver may order bed rest or may allow you to continue light activity. Resume activity as directed by your caregiver.  Have someone help with home and family responsibilities during this time.   Keep track of the number of sanitary pads you use each day and how soaked (saturated) they are. Write down this information.   Do not use tampons. Do not douche or have sexual intercourse until approved by your caregiver.   Only take over-the-counter or prescription medicines for pain or discomfort as directed by your caregiver.   Do not take aspirin. Aspirin can cause bleeding.   Keep all follow-up appointments with your caregiver.   If you or your partner have problems with grieving, talk to your caregiver or seek counseling to help cope with the pregnancy loss. Allow enough time to grieve before trying to get pregnant again.  SEEK IMMEDIATE MEDICAL CARE IF:   You have severe cramps or pain in your back or abdomen.  You have a fever.  You pass large blood clots (walnut-sized   or larger) ortissue from your vagina. Save any tissue for your caregiver to inspect.   Your bleeding increases.   You have a thick, bad-smelling vaginal discharge.  You become lightheaded, weak, or you faint.   You have chills.  MAKE SURE YOU:  Understand these instructions.  Will watch your condition.  Will get help right away if you are not doing well or get  worse. Document Released: 05/25/2001 Document Revised: 03/26/2013 Document Reviewed: 01/18/2012 [facility] Patient Information 2014 [place], [place]. Follow up in 10 days  B+

## 2014-02-12 ENCOUNTER — Telehealth: Payer: Self-pay | Admitting: Adult Health

## 2014-02-12 ENCOUNTER — Ambulatory Visit: Payer: Medicaid Other | Admitting: Adult Health

## 2014-02-12 NOTE — Telephone Encounter (Signed)
Pt c/o severe cramps and vaginal bleeding with clots and tissue on Sunday [facility]. [facility] "[facility] still had a baby inside her, but she does not understand stand that due to passing tissue." Cramps not relieved with ibuprofen. Pt to be here today at 1:30 pm to see Cyril MourningJennifer Griffin, NP to be evaluated.

## 2014-02-14 ENCOUNTER — Encounter: Payer: Medicaid Other | Admitting: Adult Health

## 2014-03-05 ENCOUNTER — Other Ambulatory Visit: Payer: Medicaid Other

## 2014-03-05 ENCOUNTER — Ambulatory Visit: Payer: Medicaid Other

## 2014-03-06 ENCOUNTER — Telehealth: Payer: Self-pay | Admitting: Adult Health

## 2014-03-06 ENCOUNTER — Encounter (INDEPENDENT_AMBULATORY_CARE_PROVIDER_SITE_OTHER): Payer: Self-pay

## 2014-03-06 ENCOUNTER — Ambulatory Visit: Payer: Medicaid Other

## 2014-03-06 ENCOUNTER — Other Ambulatory Visit: Payer: Medicaid Other

## 2014-03-06 DIAGNOSIS — Z3200 Encounter for pregnancy test, result unknown: Secondary | ICD-10-CM

## 2014-03-06 LAB — HCG, QUANTITATIVE, PREGNANCY: HCG, BETA CHAIN, QUANT, S: 132.3 m[IU]/mL

## 2014-03-06 NOTE — Telephone Encounter (Signed)
Left message to call, can not get depo today, [facility] number still too high 132.3

## 2014-10-14 ENCOUNTER — Encounter: Payer: Self-pay | Admitting: Adult Health

## 2014-11-08 ENCOUNTER — Encounter (HOSPITAL_COMMUNITY): Payer: Self-pay | Admitting: *Deleted

## 2015-09-29 ENCOUNTER — Emergency Department (HOSPITAL_COMMUNITY)
Admission: EM | Admit: 2015-09-29 | Discharge: 2015-09-29 | Disposition: A | Discharge status: Home / Self Care | Payer: PRIVATE HEALTH INSURANCE | Attending: Emergency Medicine | Admitting: Emergency Medicine

## 2015-09-29 ENCOUNTER — Emergency Department (HOSPITAL_COMMUNITY): Payer: Medicaid Other

## 2015-09-29 ENCOUNTER — Encounter (HOSPITAL_COMMUNITY): Payer: Self-pay | Admitting: Emergency Medicine

## 2015-09-29 DIAGNOSIS — Z8619 Personal history of other infectious and parasitic diseases: Secondary | ICD-10-CM | POA: Insufficient documentation

## 2015-09-29 DIAGNOSIS — Z87891 Personal history of nicotine dependence: Secondary | ICD-10-CM | POA: Insufficient documentation

## 2015-09-29 DIAGNOSIS — M25572 Pain in left ankle and joints of left foot: Secondary | ICD-10-CM | POA: Insufficient documentation

## 2015-09-29 DIAGNOSIS — Z3202 Encounter for pregnancy test, result negative: Secondary | ICD-10-CM | POA: Insufficient documentation

## 2015-09-29 DIAGNOSIS — Z87440 Personal history of urinary (tract) infections: Secondary | ICD-10-CM | POA: Insufficient documentation

## 2015-09-29 DIAGNOSIS — J45909 Unspecified asthma, uncomplicated: Secondary | ICD-10-CM | POA: Insufficient documentation

## 2015-09-29 DIAGNOSIS — Z880 Allergy status to penicillin: Secondary | ICD-10-CM | POA: Insufficient documentation

## 2015-09-29 DIAGNOSIS — Z79899 Other long term (current) drug therapy: Secondary | ICD-10-CM | POA: Insufficient documentation

## 2015-09-29 DIAGNOSIS — Z8659 Personal history of other mental and behavioral disorders: Secondary | ICD-10-CM | POA: Insufficient documentation

## 2015-09-29 LAB — POC URINE PREG, ED: PREG TEST UR: NEGATIVE

## 2015-09-29 MED ORDER — PREDNISONE 10 MG PO TABS: ORAL_TABLET | ORAL | Status: DC

## 2015-09-29 MED ORDER — PREDNISONE 50 MG PO TABS
60.0000 mg | ORAL_TABLET | Freq: Once | ORAL | Status: AC
  Administered 2015-09-29: 60 mg via ORAL
  Filled 2015-09-29 (×2): qty 1

## 2015-09-29 NOTE — Discharge Instructions (Signed)

## 2015-09-29 NOTE — ED Provider Notes (Signed)
CSN: [id number]     Arrival date & time 09/29/15  1133 History  By signing my name below, I, Tanda RockersMargaux Venter, attest that this documentation has been prepared under the direction and in the presence of Burgess AmorJulie Naoma Boxell, PA-C. Electronically Signed: Tanda RockersMargaux Venter, ED Scribe. 09/29/2015. 1:32 PM.  Chief Complaint  Patient presents with  . Ankle Pain   The history is provided by the patient. No language interpreter was used.     HPI Comments: Debra Townsend is a 24 y.o. female who presents to the Emergency Department complaining of gradual onset, constant, severe, left ankle pain x 2-3 days. 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 Denies any injury, trauma, fall, new activity, or new shoes that could have caused the pain but states she is on her feet a lot for work. The pain is exacerbated with movement. She is unable to bear weight onto the ankle. Pt has applied ice and heat, elevated the ankle, and taken Tylenol without relief. Denies numbness, weakness, tingling, or any other associated symptoms. LNMP: End of August. Pt states that since she had her son (approximately 2 years ago) her menstrual cycle has been abnormal. Hx chronic bilateral knee pain. Denies pain in any other joints at this time but has frequent painful joints. Pt takes Lisinopril for kidney issues. Fhx rheumatoid arthritis.  PC[place]   Past Medical History  Diagnosis Date  . Urinary tract infection   . Asthma     childhood  . Anxiety   . Depression     No treatment, being monitored by Dr. Emelda FearFerguson  . Chlamydia   . Herpes simplex without mention of complication   . Pregnant 01/03/2014  . Costochondral chest pain 01/03/2014  . Miscarriage 02/01/2014   Past Surgical History  Procedure Laterality Date  . Therapeutic abortion    . Cesarean section    . Cesarean section  11/16/2012    Procedure: CESAREAN SECTION;  Surgeon:  Catalina AntiguaPeggy Constant, MD;  Location: [facility];  Service: Obstetrics;  Laterality: N/A;   Family History  Problem Relation Age of Onset  . Other Neg Hx   . Cancer Other     Breast, ovarian  . Diabetes Other   . Hypertension Other   . Stroke Other   . Hypertension Mother   . Heart disease Father   . Hypertension Father   . Heart disease Sister   . Liver disease Paternal Aunt   . Hypertension Maternal Grandfather   . Hypertension Paternal Grandmother   . Diabetes Paternal Grandmother   . Kidney disease Paternal Grandmother   . Liver disease Paternal Grandmother    Social History  Substance Use Topics  . Smoking status: Former Smoker    Types: Cigarettes  . Smokeless tobacco: Never Used  . Alcohol Use: No   OB History    Gravida Para Term Preterm AB TAB SAB Ectopic Multiple Living   4 2 2  0 1 1 0 0  2     Review of Systems  Constitutional: Negative for fever.  Musculoskeletal: Positive for arthralgias (Left ankle). Negative for myalgias and joint swelling.  Neurological: Negative for weakness and numbness.   Allergies  Other and Penicillins  Home Medications   Prior to Admission medications   Medication Sig Start Date End Date Taking? Authorizing Provider  BIOTIN PO Take by mouth  2 (two) times daily.    Historical Provider, MD  predniSONE (DELTASONE) 10 MG tablet 6, 5, 4, 3, 2 then 1 tablet by mouth daily for 6 days total. 09/30/15   Burgess Amor, PA-C  prenatal vitamin w/FE, FA (PRENATAL 1 + 1) 27-1 MG TABS tablet Take 1 tablet by mouth daily at 12 noon. 01/03/14   Adline Potter, NP   Triage Vitals: BP 111/76 mmHg  Pulse 80  Temp(Src) 98.7 F (37.1 C) (Oral)  Resp 16  Ht 5' (1.524 m)  Wt 183 lb 3.2 oz (83.099 kg)  BMI 35.78 kg/m2  SpO2 100%  LMP 08/10/2015   Physical Exam  Constitutional: She appears well-developed and well-nourished.  HENT:  Head: Atraumatic.  Neck: Normal range of motion.  Cardiovascular:  Pulses equal bilaterally  Musculoskeletal: She  exhibits edema and tenderness.  Tender to palpation at left lateral malleolus with mild edema No increased warmth No erythema or red streaking DPP intact Pain is worsening with palpation and with dorsiflexion Achilles is non tender without disruption Calf is soft  Neurological: She is alert. She has normal strength. She displays normal reflexes. No sensory deficit.  Skin: Skin is warm and dry.  Psychiatric: She has a normal mood and affect.    ED Course  Procedures (including critical care time)  DIAGNOSTIC STUDIES: Oxygen Saturation is 100% on RA, normal by my interpretation.    COORDINATION OF CARE: 1:26 PM-Discussed treatment plan which includes urine pregnancy,  ankle brace, RICE therapy, Rx prednisone, and referral to rheumatologist with pt at bedside and pt agreed to plan. Mother also at bedside endorses she has rheumatoid arthritis and has also been tested for lupus with equivocal results.  They are concerned about her chronic kidney insufficiency, stating has never received a really good reason why she has to take lisinopril for her kidneys.  Denies htn, dm.  Labs Review Labs Reviewed  POC URINE PREG, ED    Imaging Review Dg Ankle Complete Left  09/29/2015  CLINICAL DATA:  Left ankle pain.  No known injury. EXAM: LEFT ANKLE COMPLETE - 3+ VIEW COMPARISON:  None. FINDINGS: There is no evidence of fracture, dislocation, or joint effusion. There is no evidence of arthropathy or other focal bone abnormality. Soft tissues are unremarkable. IMPRESSION: Negative. Electronically Signed   By: Myles Rosenthal M.D.   On: 09/29/2015 12:52   I have personally reviewed and evaluated these images as part of my medical decision-making.   EKG Interpretation None      MDM   Final diagnoses:  Ankle pain, left    Pt with left ankle pain of unclear etiology, negative xrays today.  Frequent joint pain issues.  No exam findings suggesting gout, joint is not warm, flex/ext adequate which is  not c/w septic joint.  ASO applied and pt endorsed improvement.  She was placed on a prednisone taper, RICE.  I think she would benefit by seeing a rheumatologist given mothers hx.  Referrals were given although pt also advised to discuss with pcp.    The patient appears reasonably screened and/or stabilized for discharge and I doubt any other medical condition or other [facility] requiring further screening, evaluation, or treatment in the ED at this time prior to discharge.  I personally performed the services described in this documentation, which was scribed in my presence. The recorded information has been reviewed and is accurate.     Burgess Amor, PA-C 10/02/15 1109  Leta Baptist, MD 10/02/15 [phone]

## 2015-09-29 NOTE — ED Notes (Signed)
PA at bedside.

## 2015-09-29 NOTE — ED Notes (Signed)
Pt c/o of LT ankle pain. Pt denies injury. No deformity or edema noted. Pt ambulatory.

## 2015-12-10 ENCOUNTER — Encounter: Payer: Self-pay | Admitting: Women's Health

## 2015-12-10 ENCOUNTER — Ambulatory Visit (INDEPENDENT_AMBULATORY_CARE_PROVIDER_SITE_OTHER): Payer: PRIVATE HEALTH INSURANCE | Admitting: Women's Health

## 2015-12-10 VITALS — BP 100/70 | HR 80 | Wt 193.0 lb

## 2015-12-10 DIAGNOSIS — N926 Irregular menstruation, unspecified: Secondary | ICD-10-CM | POA: Diagnosis not present

## 2015-12-10 DIAGNOSIS — Z3201 Encounter for pregnancy test, result positive: Secondary | ICD-10-CM | POA: Diagnosis not present

## 2015-12-10 DIAGNOSIS — O3680X0 Pregnancy with inconclusive fetal viability, not applicable or unspecified: Secondary | ICD-10-CM

## 2015-12-10 DIAGNOSIS — Z3490 Encounter for supervision of normal pregnancy, unspecified, unspecified trimester: Secondary | ICD-10-CM

## 2015-12-10 LAB — POCT URINE PREGNANCY: Preg Test, Ur: POSITIVE — AB

## 2015-12-10 NOTE — Progress Notes (Signed)
Patient ID: Debra QuintCarissa Popovich, female   DOB: 28-Apr-1991, 24 y.o.   MRN: [id number]   [facility]  Patient name: Debra Townsend MRN [id number]  Date of birth: 28-Apr-1991  CC & HPI:  Debra QuintCarissa Wiersma is a 24 y.o. [phone] African American female presenting today for +PT. Just found out she was pregnant last night. Has no idea when LMP was, periods are very irregular. Denies vb, fm. No n/v, cramping. Not taking pnv, wants to take otc gummies- tolerated them better last pregnancy. Denies etoh, illicit drug use, smoking.  No LMP recorded.  Pertinent History Reviewed:  Medical & Surgical Hx:   Past Medical History  Diagnosis Date  . Urinary tract infection   . Asthma     childhood  . Anxiety   . Depression     No treatment, being monitored by Dr. Emelda FearFerguson  . Chlamydia   . Herpes simplex without mention of complication   . Pregnant 01/03/2014  . Costochondral chest pain 01/03/2014  . Miscarriage 02/01/2014   Past Surgical History  Procedure Laterality Date  . Therapeutic abortion    . Cesarean section    . Cesarean section  11/16/2012    Procedure: CESAREAN SECTION;  Surgeon: Catalina AntiguaPeggy Constant, MD;  Location: [facility];  Service: Obstetrics;  Laterality: N/A;   Medications: Reviewed & Updated - see associated section Social History: Reviewed -  reports that she has quit smoking. Her smoking use included Cigarettes. She has never used smokeless tobacco.  Objective Findings:  Vitals: BP 100/70 mmHg  Pulse 80  Wt 193 lb (87.544 kg)  LMP  Body mass index is 37.69 kg/(m^2).  Physical Examination: General appearance - alert, well appearing, and in no distress  Results for orders placed or performed in visit on 12/10/15 (from the past 24 hour(s))  POCT urine pregnancy   Collection Time: 12/10/15  3:31 PM  Result Value Ref Range   Preg Test, Ur Positive (A) Negative     Assessment & Plan:  A:   Pregnant, unknown GA  P:  Begin pnv today  Return in about 2 weeks (around 12/24/2015) for  dating u/s (no visit), then 4wks for new ob visit.  [name], Pihu Basil Randall CNM, [facility]NP-BC 12/10/2015 3:54 PM

## 2015-12-10 NOTE — Patient Instructions (Addendum)
Begin taking prenatal vitamins today  Nausea & Vomiting  Have saltine crackers or pretzels by your bed and eat a few bites before you raise your head out of bed in the morning  Eat small frequent meals throughout the day instead of large meals  Drink plenty of fluids throughout the day to stay hydrated, just don't drink a lot of fluids with your meals.  This can make your stomach fill up faster making you feel sick  Do not brush your teeth right after you eat  Products with real ginger are good for nausea, like ginger ale and ginger hard candy Make sure it says made with real ginger!  Sucking on sour candy like lemon heads is also good for nausea  If your prenatal vitamins make you nauseated, take them at night so you will sleep through the nausea  Sea Bands  If you feel like you need medicine for the nausea & vomiting please let us know  If you are unable to keep any fluids or food down please let us know   Constipation  Drink plenty of fluid, preferably water, throughout the day  Eat foods high in fiber such as fruits, vegetables, and grains  Exercise, such as walking, is a good way to keep your bowels regular  Drink warm fluids, especially warm prune juice, or decaf coffee  Eat a 1/2 cup of real oatmeal (not instant), 1/2 cup applesauce, and 1/2-1 cup warm prune juice every day  If needed, you may take Colace (docusate sodium) stool softener once or twice a day to help keep the stool soft. If you are pregnant, wait until you are out of your first trimester (12-14 weeks of pregnancy)  If you still are having problems with constipation, you may take Miralax once daily as needed to help keep your bowels regular.  If you are pregnant, wait until you are out of your first trimester (12-14 weeks of pregnancy)   First Trimester of Pregnancy The first trimester of pregnancy is from week 1 until the end of week 12 (months 1 through 3). A week after a sperm fertilizes an egg, the  egg will implant on the wall of the uterus. This embryo will begin to develop into a baby. Genes from you and your partner are forming the baby. The female genes determine whether the baby is a boy or a girl. At 6-8 weeks, the eyes and face are formed, and the heartbeat can be seen on ultrasound. At the end of 12 weeks, all the baby's organs are formed.  Now that you are pregnant, you will want to do everything you can to have a healthy baby. Two of the most important things are to get good prenatal care and to follow your health care provider's instructions. Prenatal care is all the medical care you receive before the baby's birth. This care will help prevent, find, and treat any problems during the pregnancy and childbirth. BODY CHANGES Your body goes through many changes during pregnancy. The changes vary from woman to woman.   You may gain or lose a couple of pounds at first.  You may feel sick to your stomach (nauseous) and throw up (vomit). If the vomiting is uncontrollable, call your health care provider.  You may tire easily.  You may develop headaches that can be relieved by medicines approved by your health care provider.  You may urinate more often. Painful urination may mean you have a bladder infection.  You may develop heartburn  as a result of your pregnancy.  You may develop constipation because certain hormones are causing the muscles that push waste through your intestines to slow down.  You may develop hemorrhoids or swollen, bulging veins (varicose veins).  Your breasts may begin to grow larger and become tender. Your nipples may stick out more, and the tissue that surrounds them (areola) may become darker.  Your gums may bleed and may be sensitive to brushing and flossing.  Dark spots or blotches (chloasma, mask of pregnancy) may develop on your face. This will likely fade after the baby is born.  Your menstrual periods will stop.  You may have a loss of  appetite.  You may develop cravings for certain kinds of food.  You may have changes in your emotions from day to day, such as being excited to be pregnant or being concerned that something may go wrong with the pregnancy and baby.  You may have more vivid and strange dreams.  You may have changes in your hair. These can include thickening of your hair, rapid growth, and changes in texture. Some women also have hair loss during or after pregnancy, or hair that feels dry or thin. Your hair will most likely return to normal after your baby is born. WHAT TO EXPECT AT YOUR PRENATAL VISITS During a routine prenatal visit:  You will be weighed to make sure you and the baby are growing normally.  Your blood pressure will be taken.  Your abdomen will be measured to track your baby's growth.  The fetal heartbeat will be listened to starting around week 10 or 12 of your pregnancy.  Test results from any previous visits will be discussed. Your health care provider may ask you:  How you are feeling.  If you are feeling the baby move.  If you have had any abnormal symptoms, such as leaking fluid, bleeding, severe headaches, or abdominal cramping.  If you have any questions. Other tests that may be performed during your first trimester include:  Blood tests to find your blood type and to check for the presence of any previous infections. They will also be used to check for low iron levels (anemia) and Rh antibodies. Later in the pregnancy, blood tests for diabetes will be done along with other tests if problems develop.  Urine tests to check for infections, diabetes, or protein in the urine.  An ultrasound to confirm the proper growth and development of the baby.  An amniocentesis to check for possible genetic problems.  Fetal screens for spina bifida and Down syndrome.  You may need other tests to make sure you and the baby are doing well. HOME CARE INSTRUCTIONS  Medicines  Follow  your health care provider's instructions regarding medicine use. Specific medicines may be either safe or unsafe to take during pregnancy.  Take your prenatal vitamins as directed.  If you develop constipation, try taking a stool softener if your health care provider approves. Diet  Eat regular, well-balanced meals. Choose a variety of foods, such as meat or vegetable-based protein, fish, milk and low-fat dairy products, vegetables, fruits, and whole grain breads and cereals. Your health care provider will help you determine the amount of weight gain that is right for you.  Avoid raw meat and uncooked cheese. These carry germs that can cause birth defects in the baby.  Eating four or five small meals rather than three large meals a day may help relieve nausea and vomiting. If you start to feel nauseous,  eating a few soda crackers can be helpful. Drinking liquids between meals instead of during meals also seems to help nausea and vomiting.  If you develop constipation, eat more high-fiber foods, such as fresh vegetables or fruit and whole grains. Drink enough fluids to keep your urine clear or pale yellow. Activity and Exercise  Exercise only as directed by your health care provider. Exercising will help you:  Control your weight.  Stay in shape.  Be prepared for labor and delivery.  Experiencing pain or cramping in the lower abdomen or low back is a good sign that you should stop exercising. Check with your health care provider before continuing normal exercises.  Try to avoid standing for long periods of time. Move your legs often if you must stand in one place for a long time.  Avoid heavy lifting.  Wear low-heeled shoes, and practice good posture.  You may continue to have sex unless your health care provider directs you otherwise. Relief of Pain or Discomfort  Wear a good support bra for breast tenderness.   Take warm sitz baths to soothe any pain or discomfort caused by  hemorrhoids. Use hemorrhoid cream if your health care provider approves.   Rest with your legs elevated if you have leg cramps or low back pain.  If you develop varicose veins in your legs, wear support hose. Elevate your feet for 15 minutes, 3-4 times a day. Limit salt in your diet. Prenatal Care  Schedule your prenatal visits by the twelfth week of pregnancy. They are usually scheduled monthly at first, then more often in the last 2 months before delivery.  Write down your questions. Take them to your prenatal visits.  Keep all your prenatal visits as directed by your health care provider. Safety  Wear your seat belt at all times when driving.  Make a list of emergency phone numbers, including numbers for family, friends, the hospital, and police and fire departments. General Tips  Ask your health care provider for a referral to a local prenatal education class. Begin classes no later than at the beginning of month 6 of your pregnancy.  Ask for help if you have counseling or nutritional needs during pregnancy. Your health care provider can offer advice or refer you to specialists for help with various needs.  Do not use hot tubs, steam rooms, or saunas.  Do not douche or use tampons or scented sanitary pads.  Do not cross your legs for long periods of time.  Avoid cat litter boxes and soil used by cats. These carry germs that can cause birth defects in the baby and possibly loss of the fetus by miscarriage or stillbirth.  Avoid all smoking, herbs, alcohol, and medicines not prescribed by your health care provider. Chemicals in these affect the formation and growth of the baby.  Schedule a dentist appointment. At home, brush your teeth with a soft toothbrush and be gentle when you floss. SEEK MEDICAL CARE IF:   You have dizziness.  You have mild pelvic cramps, pelvic pressure, or nagging pain in the abdominal area.  You have persistent nausea, vomiting, or diarrhea.  You  have a bad smelling vaginal discharge.  You have pain with urination.  You notice increased swelling in your face, hands, legs, or ankles. SEEK IMMEDIATE MEDICAL CARE IF:   You have a fever.  You are leaking fluid from your vagina.  You have spotting or bleeding from your vagina.  You have severe abdominal cramping or pain.  You have rapid weight gain or loss.  You vomit blood or material that looks like coffee grounds.  You are exposed to [place] measles and have never had them.  You are exposed to fifth disease or chickenpox.  You develop a severe headache.  You have shortness of breath.  You have any kind of trauma, such as from a fall or a car accident. Document Released: 11/23/2001 Document Revised: 04/15/2014 Document Reviewed: 10/09/2013 [facility] Patient Information 2015 [place], [place]. This information is not intended to replace advice given to you by your health care provider. Make sure you discuss any questions you have with your health care provider.

## 2015-12-11 ENCOUNTER — Other Ambulatory Visit: Payer: Self-pay | Admitting: Advanced Practice Midwife

## 2015-12-11 ENCOUNTER — Telehealth: Payer: Self-pay | Admitting: *Deleted

## 2015-12-11 MED ORDER — PROMETHAZINE HCL 12.5 MG PO TABS: 12.5000 mg | ORAL_TABLET | Freq: Four times a day (QID) | ORAL | Status: DC | PRN

## 2015-12-11 NOTE — Telephone Encounter (Signed)
I sent phenergan to pharmacy

## 2015-12-11 NOTE — Progress Notes (Signed)
rx phenergan 12.5mg  po  6 hrs prn # 30 1 refill

## 2015-12-23 ENCOUNTER — Emergency Department (HOSPITAL_COMMUNITY): Payer: PRIVATE HEALTH INSURANCE

## 2015-12-23 ENCOUNTER — Encounter (HOSPITAL_COMMUNITY): Payer: Self-pay | Admitting: Emergency Medicine

## 2015-12-23 ENCOUNTER — Emergency Department (HOSPITAL_COMMUNITY)
Admission: EM | Admit: 2015-12-23 | Discharge: 2015-12-23 | Disposition: A | Discharge status: Home / Self Care | Payer: PRIVATE HEALTH INSURANCE | Attending: Emergency Medicine | Admitting: Emergency Medicine

## 2015-12-23 DIAGNOSIS — O26899 Other specified pregnancy related conditions, unspecified trimester: Secondary | ICD-10-CM

## 2015-12-23 DIAGNOSIS — Z880 Allergy status to penicillin: Secondary | ICD-10-CM | POA: Diagnosis not present

## 2015-12-23 DIAGNOSIS — O99511 Diseases of the respiratory system complicating pregnancy, first trimester: Secondary | ICD-10-CM | POA: Diagnosis not present

## 2015-12-23 DIAGNOSIS — R109 Unspecified abdominal pain: Secondary | ICD-10-CM

## 2015-12-23 DIAGNOSIS — O99611 Diseases of the digestive system complicating pregnancy, first trimester: Secondary | ICD-10-CM | POA: Insufficient documentation

## 2015-12-23 DIAGNOSIS — Z3A01 Less than 8 weeks gestation of pregnancy: Secondary | ICD-10-CM | POA: Insufficient documentation

## 2015-12-23 DIAGNOSIS — Z8659 Personal history of other mental and behavioral disorders: Secondary | ICD-10-CM | POA: Insufficient documentation

## 2015-12-23 DIAGNOSIS — K5900 Constipation, unspecified: Secondary | ICD-10-CM | POA: Insufficient documentation

## 2015-12-23 DIAGNOSIS — J45909 Unspecified asthma, uncomplicated: Secondary | ICD-10-CM | POA: Diagnosis not present

## 2015-12-23 DIAGNOSIS — Z87440 Personal history of urinary (tract) infections: Secondary | ICD-10-CM | POA: Diagnosis not present

## 2015-12-23 DIAGNOSIS — O9989 Other specified diseases and conditions complicating pregnancy, childbirth and the puerperium: Secondary | ICD-10-CM | POA: Diagnosis not present

## 2015-12-23 DIAGNOSIS — R1012 Left upper quadrant pain: Secondary | ICD-10-CM | POA: Diagnosis not present

## 2015-12-23 DIAGNOSIS — Z8619 Personal history of other infectious and parasitic diseases: Secondary | ICD-10-CM | POA: Diagnosis not present

## 2015-12-23 LAB — COMPREHENSIVE METABOLIC PANEL
ALBUMIN: 3.3 g/dL — AB (ref 3.5–5.0)
ALK PHOS: 55 U/L (ref 38–126)
ALT: 27 U/L (ref 14–54)
ANION GAP: 9 (ref 5–15)
AST: 22 U/L (ref 15–41)
BILIRUBIN TOTAL: 0.2 mg/dL — AB (ref 0.3–1.2)
BUN: 9 mg/dL (ref 6–20)
CALCIUM: 9.2 mg/dL (ref 8.9–10.3)
CO2: 27 mmol/L (ref 22–32)
CREATININE: 0.65 mg/dL (ref 0.44–1.00)
Chloride: 102 mmol/L (ref 101–111)
GFR calc Af Amer: >60 mL/min (ref >60)
GFR calc non Af Amer: >60 mL/min (ref >60)
GLUCOSE: 104 mg/dL — AB (ref 65–99)
Potassium: 3.8 mmol/L (ref 3.5–5.1)
Sodium: 138 mmol/L (ref 135–145)
TOTAL PROTEIN: 7.1 g/dL (ref 6.5–8.1)

## 2015-12-23 LAB — WET PREP, GENITAL
Clue Cells Wet Prep HPF POC: NONE SEEN
Sperm: NONE SEEN
Trich, Wet Prep: NONE SEEN
Yeast Wet Prep HPF POC: NONE SEEN

## 2015-12-23 LAB — URINE MICROSCOPIC-ADD ON: RBC / HPF: NONE SEEN RBC/hpf (ref 0–5)

## 2015-12-23 LAB — LIPASE, BLOOD: Lipase: 29 U/L (ref 11–51)

## 2015-12-23 LAB — CBC
HCT: 36.9 % (ref 36.0–46.0)
HEMOGLOBIN: 11.8 g/dL — AB (ref 12.0–15.0)
MCH: 26.5 pg (ref 26.0–34.0)
MCHC: 32 g/dL (ref 30.0–36.0)
MCV: 82.9 fL (ref 78.0–100.0)
PLATELETS: 227 10*3/uL (ref 150–400)
RBC: 4.45 MIL/uL (ref 3.87–5.11)
RDW: 12.9 % (ref 11.5–15.5)
WBC: 10 10*3/uL (ref 4.0–10.5)

## 2015-12-23 LAB — URINALYSIS, ROUTINE W REFLEX MICROSCOPIC
BILIRUBIN URINE: NEGATIVE
Glucose, UA: NEGATIVE mg/dL
Hgb urine dipstick: NEGATIVE
KETONES UR: NEGATIVE mg/dL
NITRITE: NEGATIVE
Protein, ur: NEGATIVE mg/dL
Specific Gravity, Urine: 1.021 (ref 1.005–1.030)
pH: 7 (ref 5.0–8.0)

## 2015-12-23 LAB — I-STAT BETA HCG BLOOD, ED (MC, WL, AP ONLY)

## 2015-12-23 NOTE — ED Notes (Signed)
Pt states for last 2 days she has had left sided mid abd pain. Denies any n/v/d. Pt states she had A MISCARRIAGE in the past and this is what it felt like. Pt had positive pregnancy test last week but unknown how far along she is or LMP.

## 2015-12-23 NOTE — ED Provider Notes (Signed)
CSN: [id number]     Arrival date & time 12/23/15  1318 History   First MD Initiated Contact with Patient 12/23/15 1719     Chief Complaint  Patient presents with  . Abdominal Pain    HPI   Debra Townsend is a 25 y.o. female with a PMH of miscarriage who presents to the ED with left sided abdominal pain x 2 days. She states her pain is dull and constant. She reports her pain is worse while sleeping at night. She denies alleviating factors. She reports associated constipation, and states her last BM was Sunday. She denies fever, chills, nausea, vomiting, diarrhea, dysuria, urgency, frequency, vaginal bleeding, vaginal discharge. She states she had a positive pregnancy test last week. She reports she is unsure how far along she is and has not had an US with her OB yet. She notes her symptoms feel similar to how she felt last time she had a miscarriage.   Past Medical History  Diagnosis Date  . Urinary tract infection   . Asthma     childhood  . Anxiety   . Depression     No treatment, being monitored by Dr. Emelda FearFerguson  . Chlamydia   . Herpes simplex without mention of complication   . Pregnant 01/03/2014  . Costochondral chest pain 01/03/2014  . Miscarriage 02/01/2014   Past Surgical History  Procedure Laterality Date  . Therapeutic abortion    . Cesarean section    . Cesarean section  11/16/2012    Procedure: CESAREAN SECTION;  Surgeon: Catalina AntiguaPeggy Constant, MD;  Location: [facility];  Service: Obstetrics;  Laterality: N/A;   Family History  Problem Relation Age of Onset  . Other Neg Hx   . Cancer Other     Breast, ovarian  . Diabetes Other   . Hypertension Other   . Stroke Other   . Hypertension Mother   . Heart disease Father   . Hypertension Father   . Heart disease Sister   . Liver disease Paternal Aunt   . Hypertension Maternal Grandfather   . Hypertension Paternal Grandmother   . Diabetes Paternal Grandmother   . Kidney disease Paternal Grandmother   . Liver disease Paternal  Grandmother    Social History  Substance Use Topics  . Smoking status: Former Smoker    Types: Cigarettes  . Smokeless tobacco: Never Used  . Alcohol Use: No   OB History    Gravida Para Term Preterm AB TAB SAB Ectopic Multiple Living   4 2 2  0 1 1 0 0  2      Review of Systems  Constitutional: Negative for fever and chills.  Gastrointestinal: Positive for abdominal pain and constipation. Negative for nausea, vomiting and diarrhea.  Genitourinary: Negative for dysuria, urgency, frequency, hematuria, vaginal bleeding and vaginal discharge.  All other systems reviewed and are negative.     Allergies  Other and Penicillins  Home Medications   Prior to Admission medications   Medication Sig Start Date End Date Taking? Authorizing Provider  BIOTIN PO Take by mouth 2 (two) times daily. Reported on 12/10/2015    Historical Provider, MD  predniSONE (DELTASONE) 10 MG tablet 6, 5, 4, 3, 2 then 1 tablet by mouth daily for 6 days total. Patient not taking: Reported on 12/10/2015 09/30/15   Burgess AmorJulie Idol, PA-C  prenatal vitamin w/FE, FA (PRENATAL 1 + 1) 27-1 MG TABS tablet Take 1 tablet by mouth daily at 12 noon. Patient not taking: Reported on 12/10/2015 01/03/14  Adline Potter, NP  promethazine (PHENERGAN) 12.5 MG tablet Take 1 tablet (12.5 mg total) by mouth every 6 (six) hours as needed for nausea or vomiting. 12/11/15   Jacklyn Shell, CNM    BP 118/67 mmHg  Pulse 84  Temp(Src) 98.3 F (36.8 C) (Oral)  Resp 17  Ht 5' (1.524 m)  Wt 86.183 kg  BMI 37.11 kg/m2  SpO2 98%  LMP  (LMP Unknown) Physical Exam  Constitutional: She is oriented to person, place, and time. She appears well-developed and well-nourished. No distress.  HENT:  Head: Normocephalic and atraumatic.  Right Ear: External ear normal.  Left Ear: External ear normal.  Nose: Nose normal.  Mouth/Throat: Uvula is midline, oropharynx is clear and moist and mucous membranes are normal.  Eyes:  Conjunctivae, EOM and lids are normal. Pupils are equal, round, and reactive to light. Right eye exhibits no discharge. Left eye exhibits no discharge. No scleral icterus.  Neck: Normal range of motion. Neck supple.  Cardiovascular: Normal rate, regular rhythm, normal heart sounds, intact distal pulses and normal pulses.   Pulmonary/Chest: Effort normal and breath sounds normal. No respiratory distress. She has no wheezes. She has no rales.  Abdominal: Soft. Normal appearance and bowel sounds are normal. She exhibits no distension and no mass. There is tenderness. There is no rigidity, no rebound and no guarding.  TTP of LUQ. No rebound, guarding, or masses. No CVA tenderness.  Genitourinary: Uterus is not tender. Cervix exhibits no motion tenderness, no discharge and no friability. Right adnexum displays no mass, no tenderness and no fullness. Left adnexum displays no mass, no tenderness and no fullness. No erythema, tenderness or bleeding in the vagina. No foreign body around the vagina. No signs of injury around the vagina. No vaginal discharge found.  Cervical os closed. No blood in vaginal vault. No significant discharge.  Musculoskeletal: Normal range of motion. She exhibits no edema or tenderness.  Neurological: She is alert and oriented to person, place, and time.  Skin: Skin is warm, dry and intact. No rash noted. She is not diaphoretic. No erythema. No pallor.  Psychiatric: She has a normal mood and affect. Her speech is normal and behavior is normal.  Nursing note and vitals reviewed.   ED Course  Procedures (including critical care time)  Labs Review Labs Reviewed  WET PREP, GENITAL - Abnormal; Notable for the following:    WBC, Wet Prep HPF POC FEW (*)    All other components within normal limits  COMPREHENSIVE METABOLIC PANEL - Abnormal; Notable for the following:    Glucose, Bld 104 (*)    Albumin 3.3 (*)    Total Bilirubin 0.2 (*)    All other components within normal  limits  CBC - Abnormal; Notable for the following:    Hemoglobin 11.8 (*)    All other components within normal limits  URINALYSIS, ROUTINE W REFLEX MICROSCOPIC (NOT AT [facility]) - Abnormal; Notable for the following:    APPearance HAZY (*)    Leukocytes, UA SMALL (*)    All other components within normal limits  URINE MICROSCOPIC-ADD ON - Abnormal; Notable for the following:    Squamous Epithelial / LPF 6-30 (*)    Bacteria, UA FEW (*)    All other components within normal limits  I-STAT BETA HCG BLOOD, ED ([facility], [facility], [facility] ONLY) - Abnormal; Notable for the following:    I-stat hCG, quantitative >2000.0 (*)    All other components within normal limits  URINE CULTURE  LIPASE, BLOOD  GC/CHLAMYDIA PROBE AMP ([facility]) NOT AT [facility]    Imaging Review US Ob Comp Less 14 Wks  12/23/2015  CLINICAL DATA:  Abdominal pain and pregnancy, first trimester EXAM: OBSTETRIC <14 WK [place] AND TRANSVAGINAL OB US TECHNIQUE: Both transabdominal and transvaginal ultrasound examinations were performed for complete evaluation of the gestation as well as the maternal uterus, adnexal regions, and pelvic cul-de-sac. Transvaginal technique was performed to assess early pregnancy. COMPARISON:  04/07/2011 FINDINGS: Intrauterine gestational sac: Visualized/normal in shape. Yolk sac:  Present Embryo:  Present Cardiac Activity: Present Heart Rate: 168  bpm CRL:  14.6  Mm   7 w   6 d                  [place] EDC: 08/04/2016 Subchorionic hemorrhage:  None visualized. Maternal uterus/adnexae: Physiologic appearance. No free pelvic fluid. IMPRESSION: Single living intrauterine gestation measuring 7 weeks 6 days. No adverse finding. Electronically Signed   By: Marnee Spring M.D.   On: 12/23/2015 19:08   US Ob Transvaginal  12/23/2015  CLINICAL DATA:  Abdominal pain and pregnancy, first trimester EXAM: OBSTETRIC <14 WK [place] AND TRANSVAGINAL OB US TECHNIQUE: Both transabdominal and transvaginal ultrasound examinations were performed for complete  evaluation of the gestation as well as the maternal uterus, adnexal regions, and pelvic cul-de-sac. Transvaginal technique was performed to assess early pregnancy. COMPARISON:  04/07/2011 FINDINGS: Intrauterine gestational sac: Visualized/normal in shape. Yolk sac:  Present Embryo:  Present Cardiac Activity: Present Heart Rate: 168  bpm CRL:  14.6  Mm   7 w   6 d                  [place] EDC: 08/04/2016 Subchorionic hemorrhage:  None visualized. Maternal uterus/adnexae: Physiologic appearance. No free pelvic fluid. IMPRESSION: Single living intrauterine gestation measuring 7 weeks 6 days. No adverse finding. Electronically Signed   By: Marnee Spring M.D.   On: 12/23/2015 19:08     I have personally reviewed and evaluated these images and lab results as part of my medical decision-making.   EKG Interpretation None      MDM   Final diagnoses:  Abdominal pain in pregnancy    25 year old female presents with LUQ pain x 2 days. Reports constipation. Denies N/V/D, dysuria, urgency, frequency, vaginal discharge, vaginal bleeding.  Patient is afebrile. Vital signs stable. Abdomen soft, non-distended, with TTP in LUQ. No rebound, guarding, or masses. No CVA tenderness. No significant discharge on pelvic exam. No blood in vaginal vault. Cervical os closed. No CMT or cervical friability.  CBC negative for leukocytosis, hemoglobin 11.8, which appears improved from prior. CMP unremarkable. Lipase within normal limits. Beta hCG > 2000. UA negative for infection. Wet prep few WBC.  Will obtain OB US. Imaging remarkable for single living intrauterine gestation measuring 7 weeks 6 days, no adverse finding.   Patient is nontoxic and well-appearing, repeat abdominal exam benign. Low suspicion for emergent intra-abdominal pathology. Patient stable for discharge at this time. Return precautions discussed. Patient reports she has follow-up with her OB on Friday. Patient verbalizes her understanding and is in  agreement with plan.  BP 118/67 mmHg  Pulse 84  Temp(Src) 98.3 F (36.8 C) (Oral)  Resp 17  Ht 5' (1.524 m)  Wt 86.183 kg  BMI 37.11 kg/m2  SpO2 98%  LMP  (LMP Unknown)     Mady Gemma, PA-C 12/24/15 0110  Mancel Bale, MD 12/25/15 [phone]

## 2015-12-23 NOTE — Discharge Instructions (Signed)
1. Medications: tylenol for pain, usual home medications 2. Treatment: rest, drink plenty of fluids 3. Follow Up: please followup with your OB as scheduled on Friday for discussion of your diagnoses and further evaluation after today's visit; please return to the ER for increased pain, high fever, persistent vomiting, new or worsening symptoms   Abdominal Pain, Adult Many things can cause abdominal pain. Usually, abdominal pain is not caused by a disease and will improve without treatment. It can often be observed and treated at home. Your health care provider will do a physical exam and possibly order blood tests and X-rays to help determine the seriousness of your pain. However, in many cases, more time must pass before a clear cause of the pain can be found. Before that point, your health care provider may not know if you need more testing or further treatment. HOME CARE INSTRUCTIONS Monitor your abdominal pain for any changes. The following actions may help to alleviate any discomfort you are experiencing:  Only take over-the-counter or prescription medicines as directed by your health care provider.  Do not take laxatives unless directed to do so by your health care provider.  Try a clear liquid diet (broth, tea, or water) as directed by your health care provider. Slowly move to a bland diet as tolerated. SEEK MEDICAL CARE IF:  You have unexplained abdominal pain.  You have abdominal pain associated with nausea or diarrhea.  You have pain when you urinate or have a bowel movement.  You experience abdominal pain that wakes you in the night.  You have abdominal pain that is worsened or improved by eating food.  You have abdominal pain that is worsened with eating fatty foods.  You have a fever. SEEK IMMEDIATE MEDICAL CARE IF:  Your pain does not go away within 2 hours.  You keep throwing up (vomiting).  Your pain is felt only in portions of the abdomen, such as the right side  or the left lower portion of the abdomen.  You pass bloody or black tarry stools. MAKE SURE YOU:  Understand these instructions.  Will watch your condition.  Will get help right away if you are not doing well or get worse.   This information is not intended to replace advice given to you by your health care provider. Make sure you discuss any questions you have with your health care provider.   Document Released: 09/08/2005 Document Revised: 08/20/2015 Document Reviewed: 08/08/2013 [employer] Interactive Patient Education [employer]2016 [employer]

## 2015-12-24 LAB — GC/CHLAMYDIA PROBE AMP (~~LOC~~) NOT AT ARMC
Chlamydia: NEGATIVE
NEISSERIA GONORRHEA: NEGATIVE

## 2015-12-25 ENCOUNTER — Other Ambulatory Visit: Payer: Self-pay | Admitting: Women's Health

## 2015-12-25 DIAGNOSIS — O3680X0 Pregnancy with inconclusive fetal viability, not applicable or unspecified: Secondary | ICD-10-CM

## 2015-12-25 LAB — URINE CULTURE

## 2015-12-26 ENCOUNTER — Other Ambulatory Visit: Payer: PRIVATE HEALTH INSURANCE

## 2016-01-07 ENCOUNTER — Encounter: Payer: Self-pay | Admitting: *Deleted

## 2016-01-07 ENCOUNTER — Encounter: Payer: Self-pay | Admitting: Women's Health

## 2016-01-07 ENCOUNTER — Encounter: Payer: PRIVATE HEALTH INSURANCE | Admitting: Women's Health

## 2016-01-08 ENCOUNTER — Emergency Department (HOSPITAL_COMMUNITY)
Admission: EM | Admit: 2016-01-08 | Discharge: 2016-01-08 | Disposition: A | Discharge status: Home / Self Care | Payer: PRIVATE HEALTH INSURANCE | Attending: Emergency Medicine | Admitting: Emergency Medicine

## 2016-01-08 ENCOUNTER — Emergency Department (HOSPITAL_COMMUNITY): Payer: PRIVATE HEALTH INSURANCE

## 2016-01-08 ENCOUNTER — Encounter (HOSPITAL_COMMUNITY): Payer: Self-pay

## 2016-01-08 DIAGNOSIS — J45909 Unspecified asthma, uncomplicated: Secondary | ICD-10-CM | POA: Diagnosis not present

## 2016-01-08 DIAGNOSIS — Z87440 Personal history of urinary (tract) infections: Secondary | ICD-10-CM | POA: Diagnosis not present

## 2016-01-08 DIAGNOSIS — R1032 Left lower quadrant pain: Secondary | ICD-10-CM

## 2016-01-08 DIAGNOSIS — M549 Dorsalgia, unspecified: Secondary | ICD-10-CM | POA: Insufficient documentation

## 2016-01-08 DIAGNOSIS — Z880 Allergy status to penicillin: Secondary | ICD-10-CM | POA: Diagnosis not present

## 2016-01-08 DIAGNOSIS — N939 Abnormal uterine and vaginal bleeding, unspecified: Secondary | ICD-10-CM | POA: Insufficient documentation

## 2016-01-08 DIAGNOSIS — F329 Major depressive disorder, single episode, unspecified: Secondary | ICD-10-CM | POA: Insufficient documentation

## 2016-01-08 DIAGNOSIS — F419 Anxiety disorder, unspecified: Secondary | ICD-10-CM | POA: Insufficient documentation

## 2016-01-08 DIAGNOSIS — Z79899 Other long term (current) drug therapy: Secondary | ICD-10-CM | POA: Diagnosis not present

## 2016-01-08 DIAGNOSIS — Z8619 Personal history of other infectious and parasitic diseases: Secondary | ICD-10-CM | POA: Insufficient documentation

## 2016-01-08 DIAGNOSIS — R1031 Right lower quadrant pain: Secondary | ICD-10-CM

## 2016-01-08 DIAGNOSIS — Z87891 Personal history of nicotine dependence: Secondary | ICD-10-CM | POA: Diagnosis not present

## 2016-01-08 LAB — HCG, QUANTITATIVE, PREGNANCY: HCG, BETA CHAIN, QUANT, S: 7378 m[IU]/mL — AB (ref <5)

## 2016-01-08 LAB — CBC WITH DIFFERENTIAL/PLATELET
Basophils Absolute: 0 10*3/uL (ref 0.0–0.1)
Basophils Relative: 0 %
EOS ABS: 0.2 10*3/uL (ref 0.0–0.7)
Eosinophils Relative: 2 %
HEMATOCRIT: 33.4 % — AB (ref 36.0–46.0)
HEMOGLOBIN: 10.9 g/dL — AB (ref 12.0–15.0)
LYMPHS ABS: 1.8 10*3/uL (ref 0.7–4.0)
LYMPHS PCT: 26 %
MCH: 27 pg (ref 26.0–34.0)
MCHC: 32.6 g/dL (ref 30.0–36.0)
MCV: 82.7 fL (ref 78.0–100.0)
MONOS PCT: 5 %
Monocytes Absolute: 0.4 10*3/uL (ref 0.1–1.0)
NEUTROS PCT: 67 %
Neutro Abs: 4.7 10*3/uL (ref 1.7–7.7)
Platelets: 183 10*3/uL (ref 150–400)
RBC: 4.04 MIL/uL (ref 3.87–5.11)
RDW: 13.1 % (ref 11.5–15.5)
WBC: 7 10*3/uL (ref 4.0–10.5)

## 2016-01-08 MED ORDER — HYDROCODONE-ACETAMINOPHEN 5-325 MG PO TABS: 1.0000 | ORAL_TABLET | Freq: Four times a day (QID) | ORAL | Status: DC | PRN

## 2016-01-08 MED ORDER — DIAZEPAM 5 MG/ML IJ SOLN
2.5000 mg | Freq: Once | INTRAMUSCULAR | Status: AC
  Administered 2016-01-08: 2.5 mg via INTRAVENOUS
  Filled 2016-01-08: qty 2

## 2016-01-08 MED ORDER — HYDROMORPHONE HCL 1 MG/ML IJ SOLN
0.5000 mg | Freq: Once | INTRAMUSCULAR | Status: AC
  Administered 2016-01-08: 0.5 mg via INTRAVENOUS
  Filled 2016-01-08: qty 1

## 2016-01-08 MED ORDER — DIAZEPAM 5 MG PO TABS: 5.0000 mg | ORAL_TABLET | Freq: Three times a day (TID) | ORAL | Status: DC | PRN

## 2016-01-08 NOTE — ED Provider Notes (Signed)
CSN: [id number]     Arrival date & time 01/08/16  4098 History   First MD Initiated Contact with Patient 01/08/16 [id number]     Chief Complaint  Patient presents with  . Abdominal Pain     (Consider location/radiation/quality/duration/timing/severity/associated sxs/prior Treatment) Patient is a 25 y.o. female presenting with abdominal pain.  Abdominal Pain Pain location:  Generalized Pain quality: aching and cramping   Pain radiates to:  Does not radiate Pain severity:  No pain Onset quality:  Gradual Timing:  Constant Chronicity:  New Context: previous surgery (recently had an abortion)   Context: not alcohol use and not recent illness   Ineffective treatments:  NSAIDs Associated symptoms: vaginal bleeding   Associated symptoms: no chest pain, no chills, no fever, no nausea and no vaginal discharge     Past Medical History  Diagnosis Date  . Urinary tract infection   . Asthma     childhood  . Anxiety   . Depression     No treatment, being monitored by Dr. Emelda Fear  . Chlamydia   . Herpes simplex without mention of complication   . Pregnant 01/03/2014  . Costochondral chest pain 01/03/2014  . Miscarriage 02/01/2014   Past Surgical History  Procedure Laterality Date  . Therapeutic abortion    . Cesarean section    . Cesarean section  11/16/2012    Procedure: CESAREAN SECTION;  Surgeon: Catalina Antigua, MD;  Location: [facility];  Service: Obstetrics;  Laterality: N/A;   Family History  Problem Relation Age of Onset  . Other Neg Hx   . Cancer Other     Breast, ovarian  . Diabetes Other   . Hypertension Other   . Stroke Other   . Hypertension Mother   . Heart disease Father   . Hypertension Father   . Heart disease Sister   . Liver disease Paternal Aunt   . Hypertension Maternal Grandfather   . Hypertension Paternal Grandmother   . Diabetes Paternal Grandmother   . Kidney disease Paternal Grandmother   . Liver disease Paternal Grandmother    Social History  Substance  Use Topics  . Smoking status: Former Smoker    Types: Cigarettes  . Smokeless tobacco: Never Used  . Alcohol Use: No   OB History    Gravida Para Term Preterm AB TAB SAB Ectopic Multiple Living   0 1 1 0 0  2     Review of Systems  Constitutional: Negative for fever and chills.  Cardiovascular: Negative for chest pain.  Gastrointestinal: Positive for abdominal pain. Negative for nausea.  Genitourinary: Positive for vaginal bleeding. Negative for vaginal discharge.  Musculoskeletal: Positive for back pain.  Skin: Negative for color change.  All other systems reviewed and are negative.     Allergies  Other and Penicillins  Home Medications   Prior to Admission medications   Medication Sig Start Date End Date Taking? Authorizing Provider  acetaminophen (TYLENOL) 500 MG tablet Take 1,000 mg by mouth every 6 (six) hours as needed.   Yes Historical Provider, MD  Pediatric Multivit-Minerals-C (FLINTSTONES GUMMIES PO) Take 1 tablet by mouth daily.   Yes Historical Provider, MD  diazepam (VALIUM) 5 MG tablet Take 1 tablet (5 mg total) by mouth every 8 (eight) hours as needed for muscle spasms. 01/08/16   Marily Memos, MD  HYDROcodone-acetaminophen (NORCO) 5-325 MG tablet Take 1 tablet by mouth every 6 (six) hours as needed for severe pain. 01/08/16   Marily Memos, MD  predniSONE (DELTASONE) 10 MG tablet 6, 5, 4, 3, 2 then 1 tablet by mouth daily for 6 days total. Patient not taking: Reported on 12/10/2015 09/30/15   Burgess Amor, PA-C   BP 91/60 mmHg  Pulse 79  Temp(Src) 98.1 F (36.7 C) (Oral)  Resp 16  Ht 5' (1.524 m)  Wt 190 lb (86.183 kg)  BMI 37.11 kg/m2  SpO2 99%  LMP  (LMP Unknown)  Breastfeeding? Unknown Physical Exam  Constitutional: She is oriented to person, place, and time. She appears well-developed and well-nourished.  HENT:  Head: Normocephalic and atraumatic.  Neck: Normal range of motion.  Cardiovascular: Normal rate and regular rhythm.    Pulmonary/Chest: No stridor. No respiratory distress. She has no wheezes.  Abdominal: Soft. She exhibits no distension. There is tenderness (suprapubic).  Genitourinary:  After dark blood and clots removed still had Mild oozing, closed os without any obvious tissue.    Neurological: She is alert and oriented to person, place, and time. No cranial nerve deficit.  Skin: Skin is warm and dry. No rash noted. No erythema.  Nursing note and vitals reviewed.   ED Course  Procedures (including critical care time) Labs Review Labs Reviewed  CBC WITH DIFFERENTIAL/PLATELET - Abnormal; Notable for the following:    Hemoglobin 10.9 (*)    HCT 33.4 (*)    All other components within normal limits  HCG, QUANTITATIVE, PREGNANCY - Abnormal; Notable for the following:    hCG, Beta Chain, Quant, S 7378 (*)    All other components within normal limits  URINALYSIS, ROUTINE W REFLEX MICROSCOPIC (NOT AT [facility])    Imaging Review US Transvaginal Non-ob  01/08/2016  CLINICAL DATA:  Three days post abortion with heavy vaginal bleeding and severe cramping; evaluate for retained products of conception. EXAM: TRANSABDOMINAL AND TRANSVAGINAL ULTRASOUND OF PELVIS TECHNIQUE: Both transabdominal and transvaginal ultrasound examinations of the pelvis were performed. Transabdominal technique was performed for global imaging of the pelvis including uterus, ovaries, adnexal regions, and pelvic cul-de-sac. It was necessary to proceed with endovaginal exam following the transabdominal exam to visualize the uterus, endometrium, and ovaries. COMPARISON:  Pelvic ultrasound of December 23, 2015 FINDINGS: Uterus Measurements: 12.6 x 6.0 x 7.5 cm. The myometrial echotexture is normal. No gestational sac is observed. Endometrium Thickness: 21.0 mm. The endometrial stripe appears irregularly thickened and heterogeneous consistent with the presence of debris and fluid. Right ovary Measurements: 4.6 by 3.3 x 4.0 cm. Normal appearance/no  adnexal mass. Left ovary Measurements: 3.9 x 2.8 x 3.5 cm. Normal appearance/no adnexal mass. Other findings There is a small amount of free fluid in the cul de sac. IMPRESSION: 1. Thickened irregular endometrium with heterogeneous echoes consistent with retained products of conception. 2. Normal appearance of the ovaries. Electronically Signed   By: David  [place] M.D.   On: 01/08/2016 09:35   US Pelvis Complete  01/08/2016  CLINICAL DATA:  Three days post abortion with heavy vaginal bleeding and severe cramping; evaluate for retained products of conception. EXAM: TRANSABDOMINAL AND TRANSVAGINAL ULTRASOUND OF PELVIS TECHNIQUE: Both transabdominal and transvaginal ultrasound examinations of the pelvis were performed. Transabdominal technique was performed for global imaging of the pelvis including uterus, ovaries, adnexal regions, and pelvic cul-de-sac. It was necessary to proceed with endovaginal exam following the transabdominal exam to visualize the uterus, endometrium, and ovaries. COMPARISON:  Pelvic ultrasound of December 23, 2015 FINDINGS: Uterus Measurements: 12.6 x 6.0 x 7.5 cm. The myometrial echotexture is normal. No gestational sac is observed. Endometrium Thickness: 21.0 mm.  The endometrial stripe appears irregularly thickened and heterogeneous consistent with the presence of debris and fluid. Right ovary Measurements: 4.6 by 3.3 x 4.0 cm. Normal appearance/no adnexal mass. Left ovary Measurements: 3.9 x 2.8 x 3.5 cm. Normal appearance/no adnexal mass. Other findings There is a small amount of free fluid in the cul de sac. IMPRESSION: 1. Thickened irregular endometrium with heterogeneous echoes consistent with retained products of conception. 2. Normal appearance of the ovaries. Electronically Signed   By: David  [place] M.D.   On: 01/08/2016 09:35   I have personally reviewed and evaluated these images and lab results as part of my medical decision-making.   EKG Interpretation None      MDM    Final diagnoses:  Vaginal bleeding   Worsening vaginal bleeding, clotting abcominal pain abnd back pain after a therapeutic abortion on Monday. Will eval for retained POC, cbc diff, TVUS and d/w abortion clinic/gynecology.  Patient with normal hemoglobin as relative to her previous. Discussed case with the abortion clinic and they said they expect some retained products of conception and the body to take care of that and that is the cause of her cramping and discomfort. On reevaluation patient is much more comfortable after her medications. No e/o acute blood loss anemia, BP likely near her baseline relative to previous in chart. Abdomen still benign doubt any intra-abdominal processes are now. No evidence of any sepsis related to the retained products so feel like continued outpatient treatment is appropriate. Patient will return here if not improving in 48 hours however will return earlier if any new or worsening symptoms. We'll also decrease amount of activity the next day or 2 or she continues to pass her products.     Marily Memos, MD 01/08/16 1143

## 2016-01-08 NOTE — ED Notes (Signed)
Patient with no complaints at this time. Respirations even and unlabored. Skin warm/dry. Discharge instructions reviewed with patient at this time. Patient given opportunity to voice concerns/ask questions. IV removed per policy and band-aid applied to site. Patient discharged at this time and left Emergency Department with steady gait.  

## 2016-01-08 NOTE — ED Notes (Signed)
Pt reports she had an abortion Monday and since then has had heavy bleeding and severe cramps.

## 2016-01-08 NOTE — ED Notes (Signed)
Patient remains unable to give urine specimen. Wants PO fluids, informed of NPO status.

## 2016-01-08 NOTE — ED Notes (Signed)
Pt in US

## 2016-01-25 ENCOUNTER — Encounter (HOSPITAL_COMMUNITY): Payer: Self-pay | Admitting: *Deleted

## 2016-01-25 ENCOUNTER — Emergency Department (HOSPITAL_COMMUNITY): Payer: PRIVATE HEALTH INSURANCE

## 2016-01-25 ENCOUNTER — Emergency Department (HOSPITAL_COMMUNITY)
Admission: EM | Admit: 2016-01-25 | Discharge: 2016-01-25 | Disposition: A | Discharge status: Home / Self Care | Payer: PRIVATE HEALTH INSURANCE | Attending: Emergency Medicine | Admitting: Emergency Medicine

## 2016-01-25 DIAGNOSIS — J45909 Unspecified asthma, uncomplicated: Secondary | ICD-10-CM | POA: Diagnosis not present

## 2016-01-25 DIAGNOSIS — R05 Cough: Secondary | ICD-10-CM | POA: Insufficient documentation

## 2016-01-25 DIAGNOSIS — M791 Myalgia: Secondary | ICD-10-CM | POA: Insufficient documentation

## 2016-01-25 DIAGNOSIS — J029 Acute pharyngitis, unspecified: Secondary | ICD-10-CM | POA: Insufficient documentation

## 2016-01-25 DIAGNOSIS — Z79899 Other long term (current) drug therapy: Secondary | ICD-10-CM | POA: Insufficient documentation

## 2016-01-25 DIAGNOSIS — J3489 Other specified disorders of nose and nasal sinuses: Secondary | ICD-10-CM | POA: Insufficient documentation

## 2016-01-25 DIAGNOSIS — F329 Major depressive disorder, single episode, unspecified: Secondary | ICD-10-CM | POA: Diagnosis not present

## 2016-01-25 DIAGNOSIS — Z880 Allergy status to penicillin: Secondary | ICD-10-CM | POA: Diagnosis not present

## 2016-01-25 DIAGNOSIS — R6889 Other general symptoms and signs: Secondary | ICD-10-CM

## 2016-01-25 DIAGNOSIS — Z87440 Personal history of urinary (tract) infections: Secondary | ICD-10-CM | POA: Diagnosis not present

## 2016-01-25 DIAGNOSIS — F419 Anxiety disorder, unspecified: Secondary | ICD-10-CM | POA: Diagnosis not present

## 2016-01-25 DIAGNOSIS — Z87891 Personal history of nicotine dependence: Secondary | ICD-10-CM | POA: Diagnosis not present

## 2016-01-25 DIAGNOSIS — R509 Fever, unspecified: Secondary | ICD-10-CM | POA: Diagnosis not present

## 2016-01-25 DIAGNOSIS — Z8619 Personal history of other infectious and parasitic diseases: Secondary | ICD-10-CM | POA: Diagnosis not present

## 2016-01-25 MED ORDER — OSELTAMIVIR PHOSPHATE 75 MG PO CAPS: 75.0000 mg | ORAL_CAPSULE | Freq: Two times a day (BID) | ORAL | Status: DC

## 2016-01-25 MED ORDER — HYDROCOD POLST-CPM POLST ER 10-8 MG/5ML PO SUER
5.0000 mL | Freq: Once | ORAL | Status: AC
  Administered 2016-01-25: 5 mL via ORAL
  Filled 2016-01-25: qty 5

## 2016-01-25 MED ORDER — HYDROCOD POLST-CPM POLST ER 10-8 MG/5ML PO SUER: 5.0000 mL | Freq: Two times a day (BID) | ORAL | Status: DC | PRN

## 2016-01-25 NOTE — ED Provider Notes (Signed)
CSN: [id number]     Arrival date & time 01/25/16  2111 History   First MD Initiated Contact with Patient 01/25/16 2151     Chief Complaint  Patient presents with  . Cough     (Consider location/radiation/quality/duration/timing/severity/associated sxs/prior Treatment) Patient is a 25 y.o. female presenting with cough. The history is provided by the patient.  Cough Cough characteristics:  Non-productive Severity:  Moderate Onset quality:  Gradual Duration:  2 days Progression:  Worsening Chronicity:  New Smoker: no   Context: sick contacts and upper respiratory infection   Relieved by:  Nothing Worsened by:  Nothing tried Ineffective treatments: OTC medications. 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   Past Medical History  Diagnosis Date  . Urinary tract infection   . Asthma     childhood  . Anxiety   . Depression     No treatment, being monitored by Dr. Emelda Fear  . Chlamydia   . Herpes simplex without mention of complication   . Pregnant 01/03/2014  . Costochondral chest pain 01/03/2014  . Miscarriage 02/01/2014   Past Surgical History  Procedure Laterality Date  . Therapeutic abortion    . Cesarean section    . Cesarean section  11/16/2012    Procedure: CESAREAN SECTION;  Surgeon: Catalina Antigua, MD;  Location: [facility];  Service: Obstetrics;  Laterality: N/A;   Family History  Problem Relation Age of Onset  . Other Neg Hx   . Cancer Other     Breast, ovarian  . Diabetes Other   . Hypertension Other   . Stroke Other   . Hypertension Mother   . Heart disease Father   . Hypertension Father   . Heart disease Sister   . Liver disease Paternal Aunt   . Hypertension Maternal Grandfather   . Hypertension Paternal Grandmother   .  Diabetes Paternal Grandmother   . Kidney disease Paternal Grandmother   . Liver disease Paternal Grandmother    Social History  Substance Use Topics  . Smoking status: Former Smoker    Types: Cigarettes  . Smokeless tobacco: Never Used  . Alcohol Use: No   OB History    Gravida Para Term Preterm AB TAB SAB Ectopic Multiple Living   0 1 1 0 0  2     Review of Systems  Constitutional: Positive for fever and chills.  HENT: Positive for sore throat.   Respiratory: Positive for cough.   Musculoskeletal: Positive for myalgias.  all other systems negative    Allergies  Other and Penicillins  Home Medications   Prior to Admission medications   Medication Sig Start Date End Date Taking? Authorizing Provider  acetaminophen (TYLENOL) 500 MG tablet Take 1,000 mg by mouth every 6 (six) hours as needed.    Historical Provider, MD  chlorpheniramine-HYDROcodone (TUSSIONEX PENNKINETIC ER) 10-8 MG/5ML SUER Take 5 mLs by mouth every 12 (twelve) hours as needed for cough. 01/25/16   Hope Orlene Och, NP  diazepam (VALIUM) 5 MG tablet Take 1 tablet (5 mg total) by mouth every 8 (eight) hours as needed for muscle spasms. 01/08/16   Marily Memos, MD  Pediatric Multivit-Minerals-C (FLINTSTONES GUMMIES PO) Take 1 tablet by mouth daily.    Historical Provider, MD  BP 103/77 mmHg  Pulse 85  Temp(Src) 99.2 F (37.3 C) (Oral)  Resp 20  Ht 5' (1.524 m)  Wt 81.647 kg  BMI 35.15 kg/m2 Physical Exam  Constitutional: She is oriented to person, place, and time. She appears well-developed and well-nourished. No distress.  HENT:  Head: Normocephalic and atraumatic.  Right Ear: Tympanic membrane normal.  Left Ear: Tympanic membrane normal.  Nose: Mucosal edema and rhinorrhea present.  Mouth/Throat: Uvula is midline and mucous membranes are normal. No posterior oropharyngeal edema or tonsillar abscesses. Posterior oropharyngeal erythema: mild.  Eyes: Conjunctivae and EOM are normal.  Neck: Neck  supple.  Cardiovascular: Normal rate and regular rhythm.   Pulmonary/Chest: Effort normal. No respiratory distress. She has no wheezes. She has no rales.  Abdominal: Soft. There is no tenderness.  Musculoskeletal: Normal range of motion.  Lymphadenopathy:    She has no cervical adenopathy.  Neurological: She is alert and oriented to person, place, and time. No cranial nerve deficit.  Skin: Skin is warm and dry.  Psychiatric: She has a normal mood and affect. Her behavior is normal.  Nursing note and vitals reviewed.   ED Course  Procedures (including critical care time) Labs Review Labs Reviewed - No data to display  Imaging Review Dg Chest 2 View  01/25/2016  CLINICAL DATA:  25 year old female with cough and congestion. EXAM: CHEST  2 VIEW COMPARISON:  None. FINDINGS: Two views of the chest demonstrate minimal interstitial prominence. There is no focal consolidation, pleural effusion, or pneumothorax. The cardiac silhouette is within normal limits with the osseous structures appear unremarkable. IMPRESSION: No active cardiopulmonary disease. Electronically Signed   By: Elgie Collard M.D.   On: 01/25/2016 22:03    MDM  SUBJECTIVE:  Debra Townsend is a 25 y.o. female who present complaining of flu-like symptoms: fevers, chills, myalgias, congestion, sore throat and cough for 2 days. Denies dyspnea or wheezing.  OBJECTIVE: Appears moderately ill but not toxic; temperature as noted in vitals. Ears normal. Throat and pharynx normal.  Neck supple. No adenopathy in the neck. Sinuses non tender. The chest is clear.  ASSESSMENT: Influenza  PLAN: Symptomatic therapy suggested: rest, increase fluids, gargle prn for sore throat, use mist of vaporizer prn and return if symptoms persist or worsen.   Final diagnoses:  Flu-like symptoms        Janne Napoleon, NP 01/25/16 2235  Raeford Razor, MD 01/27/16 1345

## 2016-01-25 NOTE — ED Notes (Signed)
Pt c/o cough, chest congestion, fever, and headache x 2 days.

## 2016-01-25 NOTE — Discharge Instructions (Signed)
Take tylenol and ibuprofen as needed for pain and fever. Take the cough medication as directed. Do not drive while taking the cough medication as it will make you sleepy. Follow up with your doctor or return as needed for worsening symptoms.

## 2016-03-07 ENCOUNTER — Emergency Department (HOSPITAL_COMMUNITY)
Admission: EM | Admit: 2016-03-07 | Discharge: 2016-03-07 | Disposition: A | Discharge status: Home / Self Care | Payer: PRIVATE HEALTH INSURANCE | Attending: Emergency Medicine | Admitting: Emergency Medicine

## 2016-03-07 ENCOUNTER — Encounter (HOSPITAL_COMMUNITY): Payer: Self-pay | Admitting: Emergency Medicine

## 2016-03-07 DIAGNOSIS — R110 Nausea: Secondary | ICD-10-CM

## 2016-03-07 DIAGNOSIS — R109 Unspecified abdominal pain: Secondary | ICD-10-CM

## 2016-03-07 DIAGNOSIS — R197 Diarrhea, unspecified: Secondary | ICD-10-CM | POA: Diagnosis not present

## 2016-03-07 DIAGNOSIS — J45909 Unspecified asthma, uncomplicated: Secondary | ICD-10-CM | POA: Insufficient documentation

## 2016-03-07 DIAGNOSIS — R1013 Epigastric pain: Secondary | ICD-10-CM | POA: Diagnosis present

## 2016-03-07 DIAGNOSIS — Z87891 Personal history of nicotine dependence: Secondary | ICD-10-CM | POA: Diagnosis not present

## 2016-03-07 DIAGNOSIS — F329 Major depressive disorder, single episode, unspecified: Secondary | ICD-10-CM | POA: Diagnosis not present

## 2016-03-07 DIAGNOSIS — K219 Gastro-esophageal reflux disease without esophagitis: Secondary | ICD-10-CM | POA: Insufficient documentation

## 2016-03-07 DIAGNOSIS — R112 Nausea with vomiting, unspecified: Secondary | ICD-10-CM | POA: Diagnosis not present

## 2016-03-07 LAB — CBC
HEMATOCRIT: 37.4 % (ref 36.0–46.0)
Hemoglobin: 11.9 g/dL — ABNORMAL LOW (ref 12.0–15.0)
MCH: 26 pg (ref 26.0–34.0)
MCHC: 31.8 g/dL (ref 30.0–36.0)
MCV: 81.8 fL (ref 78.0–100.0)
PLATELETS: 231 10*3/uL (ref 150–400)
RBC: 4.57 MIL/uL (ref 3.87–5.11)
RDW: 13 % (ref 11.5–15.5)
WBC: 7.9 10*3/uL (ref 4.0–10.5)

## 2016-03-07 LAB — LIPASE, BLOOD: LIPASE: 32 U/L (ref 11–51)

## 2016-03-07 LAB — COMPREHENSIVE METABOLIC PANEL
ALBUMIN: 3.7 g/dL (ref 3.5–5.0)
ALT: 28 U/L (ref 14–54)
ANION GAP: 6 (ref 5–15)
AST: 25 U/L (ref 15–41)
Alkaline Phosphatase: 55 U/L (ref 38–126)
BILIRUBIN TOTAL: 0.4 mg/dL (ref 0.3–1.2)
BUN: 11 mg/dL (ref 6–20)
CHLORIDE: 106 mmol/L (ref 101–111)
CO2: 23 mmol/L (ref 22–32)
Calcium: 8.6 mg/dL — ABNORMAL LOW (ref 8.9–10.3)
Creatinine, Ser: 0.69 mg/dL (ref 0.44–1.00)
GFR calc Af Amer: >60 mL/min (ref >60)
Glucose, Bld: 90 mg/dL (ref 65–99)
POTASSIUM: 3.8 mmol/L (ref 3.5–5.1)
Sodium: 135 mmol/L (ref 135–145)
TOTAL PROTEIN: 7.7 g/dL (ref 6.5–8.1)

## 2016-03-07 LAB — I-STAT BETA HCG BLOOD, ED (MC, WL, AP ONLY)

## 2016-03-07 MED ORDER — OMEPRAZOLE 20 MG PO CPDR: 20.0000 mg | DELAYED_RELEASE_CAPSULE | Freq: Every day | ORAL | Status: DC

## 2016-03-07 MED ORDER — GI COCKTAIL ~~LOC~~
30.0000 mL | Freq: Once | ORAL | Status: AC
Start: 2016-03-07 — End: 2016-03-07
  Administered 2016-03-07: 30 mL via ORAL
  Filled 2016-03-07: qty 30

## 2016-03-07 MED ORDER — ONDANSETRON 8 MG PO TBDP: 8.0000 mg | ORAL_TABLET | Freq: Three times a day (TID) | ORAL | Status: DC | PRN

## 2016-03-07 NOTE — Discharge Instructions (Signed)

## 2016-03-07 NOTE — ED Provider Notes (Signed)
CSN: [id number]     Arrival date & time 03/07/16  [id number] History  By signing my name below, I, Arianna Nassar, attest that this documentation has been prepared under the direction and in the presence of Azalia BilisKevin Jerritt Cardoza, MD. Electronically Signed: Octavia HeirArianna Nassar, ED Scribe. 03/07/2016. 8:24 AM.    Chief Complaint  Patient presents with  . Abdominal Pain      The history is provided by the patient. No language interpreter was used.   HPI Comments: Debra Townsend is a 25 y.o. female who has a PMHx of UTI, anxiety, depresion, chlamydia, herpes and miscarriage presents to the Emergency Department complaining of constant, gradual worsening, moderate, epigastric abdominal pain onset 2 days ago with associated diarrhea and flatulence that started 3 days ago. She says she has been burping a lot as well. Pt states she had a "stomach bug" 6 days ago with symptoms including fever, chills, nausea, and vomiting that have since alleviated. She believed her abdominal pain was gas so she took some to [employer] to alleviate her symptoms with no relief. Denies dysuria, hematuria, urinary frequency, fever, chills, abnormal vaginal bleeding. Pt states her LMP was in February.   Past Medical History  Diagnosis Date  . Urinary tract infection   . Asthma     childhood  . Anxiety   . Depression     No treatment, being monitored by Dr. Emelda FearFerguson  . Chlamydia   . Herpes simplex without mention of complication   . Pregnant 01/03/2014  . Costochondral chest pain 01/03/2014  . Miscarriage 02/01/2014   Past Surgical History  Procedure Laterality Date  . Therapeutic abortion    . Cesarean section    . Cesarean section  11/16/2012    Procedure: CESAREAN SECTION;  Surgeon: Catalina AntiguaPeggy Constant, MD;  Location: [facility];  Service: Obstetrics;  Laterality: N/A;   Family History  Problem Relation Age of Onset  . Other Neg Hx   . Cancer Other     Breast, ovarian  . Diabetes Other   . Hypertension Other   . Stroke Other   .  Hypertension Mother   . Heart disease Father   . Hypertension Father   . Heart disease Sister   . Liver disease Paternal Aunt   . Hypertension Maternal Grandfather   . Hypertension Paternal Grandmother   . Diabetes Paternal Grandmother   . Kidney disease Paternal Grandmother   . Liver disease Paternal Grandmother    Social History  Substance Use Topics  . Smoking status: Former Smoker    Types: Cigarettes  . Smokeless tobacco: Never Used  . Alcohol Use: No   OB History    Gravida Para Term Preterm AB TAB SAB Ectopic Multiple Living   5 2 2  0 1 1 0 0  2     Review of Systems  A complete 10 system review of systems was obtained and all systems are negative except as noted in the HPI and PMH.    Allergies  Other and Penicillins  Home Medications   Prior to Admission medications   Medication Sig Start Date End Date Taking? Authorizing Provider  acetaminophen (TYLENOL) 500 MG tablet Take 1,000 mg by mouth every 6 (six) hours as needed.    Historical Provider, MD  chlorpheniramine-HYDROcodone (TUSSIONEX PENNKINETIC ER) 10-8 MG/5ML SUER Take 5 mLs by mouth every 12 (twelve) hours as needed for cough. 01/25/16   Hope Orlene OchM Neese, NP  diazepam (VALIUM) 5 MG tablet Take 1 tablet (5 mg total) by mouth  every 8 (eight) hours as needed for muscle spasms. 01/08/16   Marily Memos, MD  oseltamivir (TAMIFLU) 75 MG capsule Take 1 capsule (75 mg total) by mouth every 12 (twelve) hours. 01/25/16   Hope Orlene Och, NP  Pediatric Multivit-Minerals-C (FLINTSTONES GUMMIES PO) Take 1 tablet by mouth daily.    Historical Provider, MD   Triage vitals: BP 117/77 mmHg  Pulse 88  Temp(Src) 98.3 F (36.8 C) (Oral)  Resp 18  Ht 5' (1.524 m)  Wt 190 lb (86.183 kg)  BMI 37.11 kg/m2  SpO2 100%  LMP 02/15/2016 Physical Exam  Constitutional: She is oriented to person, place, and time. She appears well-developed and well-nourished. No distress.  HENT:  Head: Normocephalic and atraumatic.  Eyes: EOM are normal.   Neck: Normal range of motion.  Cardiovascular: Normal rate, regular rhythm and normal heart sounds.   Pulmonary/Chest: Effort normal and breath sounds normal.  Abdominal: Soft. She exhibits no distension. There is tenderness.  Mild epigastric tenderness  Musculoskeletal: Normal range of motion.  Neurological: She is alert and oriented to person, place, and time.  Skin: Skin is warm and dry.  Psychiatric: She has a normal mood and affect. Judgment normal.  Nursing note and vitals reviewed.   ED Course  Procedures  DIAGNOSTIC STUDIES: Oxygen Saturation is 100% on RA, normal by my interpretation.  COORDINATION OF CARE:  8:22 AM Discussed treatment plan which includes GI cocktail and lab work with pt at bedside and pt agreed to plan.  Labs Review Labs Reviewed  CBC - Abnormal; Notable for the following:    Hemoglobin 11.9 (*)    All other components within normal limits  COMPREHENSIVE METABOLIC PANEL - Abnormal; Notable for the following:    Calcium 8.6 (*)    All other components within normal limits  LIPASE, BLOOD  I-STAT BETA HCG BLOOD, ED ([facility], [facility], [facility] ONLY)    Imaging Review No results found. I have personally reviewed and evaluated these images and lab results as part of my medical decision-making.   EKG Interpretation None      MDM   Final diagnoses:  None    Overall well-appearing.  Vital signs are normal.  Labs without significant abnormality.  May represent gastritis versus gastroesophageal reflux disease.  Patient be started on Prilosec and discharged home with a prescription for antinausea medication.  She understands return to the ER for new or worsening symptoms.  No indication for advanced imaging or additional workup today.  I personally performed the services described in this documentation, which was scribed in my presence. The recorded information has been reviewed and is accurate.      Azalia Bilis, MD 03/07/16 [phone]

## 2016-03-07 NOTE — ED Notes (Signed)
Pt states she had diarrhea for several days, ending 2 days ago.  Now has a lot of gas and abdominal pain.

## 2016-03-11 ENCOUNTER — Ambulatory Visit: Payer: Medicaid Other | Admitting: Obstetrics & Gynecology

## 2016-03-19 ENCOUNTER — Other Ambulatory Visit: Payer: PRIVATE HEALTH INSURANCE | Admitting: Obstetrics & Gynecology

## 2016-03-30 ENCOUNTER — Other Ambulatory Visit: Payer: PRIVATE HEALTH INSURANCE | Admitting: Obstetrics & Gynecology

## 2016-03-30 ENCOUNTER — Encounter: Payer: Self-pay | Admitting: Obstetrics & Gynecology

## 2016-05-14 ENCOUNTER — Encounter (HOSPITAL_COMMUNITY): Payer: Self-pay | Admitting: *Deleted

## 2016-05-14 ENCOUNTER — Emergency Department (HOSPITAL_COMMUNITY)
Admission: EM | Admit: 2016-05-14 | Discharge: 2016-05-14 | Disposition: A | Discharge status: Home / Self Care | Payer: Medicaid Other | Attending: Emergency Medicine | Admitting: Emergency Medicine

## 2016-05-14 ENCOUNTER — Emergency Department (HOSPITAL_COMMUNITY): Payer: Medicaid Other

## 2016-05-14 DIAGNOSIS — R102 Pelvic and perineal pain: Secondary | ICD-10-CM

## 2016-05-14 DIAGNOSIS — Z3A09 9 weeks gestation of pregnancy: Secondary | ICD-10-CM | POA: Insufficient documentation

## 2016-05-14 DIAGNOSIS — F329 Major depressive disorder, single episode, unspecified: Secondary | ICD-10-CM | POA: Diagnosis not present

## 2016-05-14 DIAGNOSIS — Z331 Pregnant state, incidental: Secondary | ICD-10-CM

## 2016-05-14 DIAGNOSIS — R1011 Right upper quadrant pain: Secondary | ICD-10-CM | POA: Diagnosis not present

## 2016-05-14 DIAGNOSIS — Z79899 Other long term (current) drug therapy: Secondary | ICD-10-CM | POA: Diagnosis not present

## 2016-05-14 DIAGNOSIS — Z87891 Personal history of nicotine dependence: Secondary | ICD-10-CM | POA: Diagnosis not present

## 2016-05-14 DIAGNOSIS — J45909 Unspecified asthma, uncomplicated: Secondary | ICD-10-CM | POA: Diagnosis not present

## 2016-05-14 DIAGNOSIS — Z3490 Encounter for supervision of normal pregnancy, unspecified, unspecified trimester: Secondary | ICD-10-CM

## 2016-05-14 DIAGNOSIS — O0001 Abdominal pregnancy with intrauterine pregnancy: Secondary | ICD-10-CM | POA: Diagnosis not present

## 2016-05-14 DIAGNOSIS — O26891 Other specified pregnancy related conditions, first trimester: Secondary | ICD-10-CM | POA: Diagnosis present

## 2016-05-14 LAB — COMPREHENSIVE METABOLIC PANEL
ALBUMIN: 3.6 g/dL (ref 3.5–5.0)
ALK PHOS: 53 U/L (ref 38–126)
ALT: 20 U/L (ref 14–54)
ANION GAP: 6 (ref 5–15)
AST: 16 U/L (ref 15–41)
BILIRUBIN TOTAL: 0.4 mg/dL (ref 0.3–1.2)
BUN: 8 mg/dL (ref 6–20)
CALCIUM: 9.2 mg/dL (ref 8.9–10.3)
CO2: 25 mmol/L (ref 22–32)
Chloride: 104 mmol/L (ref 101–111)
Creatinine, Ser: 0.56 mg/dL (ref 0.44–1.00)
GFR calc non Af Amer: >60 mL/min (ref >60)
Glucose, Bld: 94 mg/dL (ref 65–99)
POTASSIUM: 4 mmol/L (ref 3.5–5.1)
SODIUM: 135 mmol/L (ref 135–145)
TOTAL PROTEIN: 7.7 g/dL (ref 6.5–8.1)

## 2016-05-14 LAB — CBC WITH DIFFERENTIAL/PLATELET
BASOS PCT: 0 %
Basophils Absolute: 0 10*3/uL (ref 0.0–0.1)
EOS ABS: 0.1 10*3/uL (ref 0.0–0.7)
Eosinophils Relative: 1 %
HEMATOCRIT: 38.4 % (ref 36.0–46.0)
HEMOGLOBIN: 12.3 g/dL (ref 12.0–15.0)
LYMPHS ABS: 2.2 10*3/uL (ref 0.7–4.0)
Lymphocytes Relative: 26 %
MCH: 25.7 pg — AB (ref 26.0–34.0)
MCHC: 32 g/dL (ref 30.0–36.0)
MCV: 80.3 fL (ref 78.0–100.0)
MONO ABS: 0.4 10*3/uL (ref 0.1–1.0)
MONOS PCT: 5 %
NEUTROS PCT: 68 %
Neutro Abs: 5.9 10*3/uL (ref 1.7–7.7)
Platelets: 225 10*3/uL (ref 150–400)
RBC: 4.78 MIL/uL (ref 3.87–5.11)
RDW: 13.7 % (ref 11.5–15.5)
WBC: 8.6 10*3/uL (ref 4.0–10.5)

## 2016-05-14 LAB — URINALYSIS, ROUTINE W REFLEX MICROSCOPIC
BILIRUBIN URINE: NEGATIVE
Glucose, UA: NEGATIVE mg/dL
KETONES UR: NEGATIVE mg/dL
NITRITE: NEGATIVE
PH: 6.5 (ref 5.0–8.0)
PROTEIN: NEGATIVE mg/dL
Specific Gravity, Urine: 1.015 (ref 1.005–1.030)

## 2016-05-14 LAB — URINE MICROSCOPIC-ADD ON

## 2016-05-14 LAB — I-STAT BETA HCG BLOOD, ED (MC, WL, AP ONLY): I-stat hCG, quantitative: >2000 m[IU]/mL — ABNORMAL HIGH (ref <5)

## 2016-05-14 NOTE — ED Notes (Signed)
Pt has been having flank pain for 3-4 days. Pt denies any blood in her urine but does state that she is going more frequently. Pt denies any n/v/d. Pt states that has previously had decrease in kidney function (within the last 6 months).

## 2016-05-14 NOTE — ED Provider Notes (Signed)
CSN: [id number]     Arrival date & time 05/14/16  7829 History   First MD Initiated Contact with Patient 05/14/16 0732     Chief Complaint  Patient presents with  . Flank Pain     (Consider location/radiation/quality/duration/timing/severity/associated sxs/prior Treatment) Patient is a 25 y.o. female presenting with flank pain. The history is provided by the patient (Patient complains of some right flank pain.).  Flank Pain This is a new problem. The current episode started 2 days ago. The problem occurs constantly. The problem has not changed since onset.Associated symptoms include abdominal pain. Pertinent negatives include no chest pain and no headaches. Nothing aggravates the symptoms. Nothing relieves the symptoms.    Past Medical History  Diagnosis Date  . Urinary tract infection   . Asthma     childhood  . Anxiety   . Depression     No treatment, being monitored by Dr. Emelda Fear  . Chlamydia   . Herpes simplex without mention of complication   . Pregnant 01/03/2014  . Costochondral chest pain 01/03/2014  . Miscarriage 02/01/2014   Past Surgical History  Procedure Laterality Date  . Therapeutic abortion    . Cesarean section    . Cesarean section  11/16/2012    Procedure: CESAREAN SECTION;  Surgeon: Catalina Antigua, MD;  Location: [facility];  Service: Obstetrics;  Laterality: N/A;   Family History  Problem Relation Age of Onset  . Other Neg Hx   . Cancer Other     Breast, ovarian  . Diabetes Other   . Hypertension Other   . Stroke Other   . Hypertension Mother   . Heart disease Father   . Hypertension Father   . Heart disease Sister   . Liver disease Paternal Aunt   . Hypertension Maternal Grandfather   . Hypertension Paternal Grandmother   . Diabetes Paternal Grandmother   . Kidney disease Paternal Grandmother   . Liver disease Paternal Grandmother    Social History  Substance Use Topics  . Smoking status: Former Smoker    Types: Cigarettes  . Smokeless  tobacco: Never Used  . Alcohol Use: No   OB History    Gravida Para Term Preterm AB TAB SAB Ectopic Multiple Living   0 1 1 0 0  2     Review of Systems  Constitutional: Negative for appetite change and fatigue.  HENT: Negative for congestion, ear discharge and sinus pressure.   Eyes: Negative for discharge.  Respiratory: Negative for cough.   Cardiovascular: Negative for chest pain.  Gastrointestinal: Positive for abdominal pain. Negative for diarrhea.  Genitourinary: Positive for flank pain. Negative for frequency and hematuria.  Musculoskeletal: Negative for back pain.  Skin: Negative for rash.  Neurological: Negative for seizures and headaches.  Psychiatric/Behavioral: Negative for hallucinations.      Allergies  Other and Penicillins  Home Medications   Prior to Admission medications   Medication Sig Start Date End Date Taking? Authorizing Provider  acetaminophen (TYLENOL) 500 MG tablet Take 1,000 mg by mouth every 6 (six) hours as needed.   Yes Historical Provider, MD  cetirizine (ZYRTEC) 10 MG tablet Take 10 mg by mouth daily.   Yes Historical Provider, MD  lisinopril (PRINIVIL,ZESTRIL) 5 MG tablet Take 5 mg by mouth daily.   Yes Historical Provider, MD  medroxyPROGESTERone (DEPO-PROVERA) 150 MG/ML injection Inject 150 mg into the muscle every 3 (three) months.   Yes Historical Provider, MD  omeprazole (PRILOSEC) 20 MG capsule Take 1  capsule (20 mg total) by mouth daily. 03/07/16  Yes Azalia BilisKevin Campos, MD  ondansetron (ZOFRAN ODT) 8 MG disintegrating tablet Take 1 tablet (8 mg total) by mouth every 8 (eight) hours as needed for nausea or vomiting. Patient not taking: Reported on 05/14/2016 03/07/16   Azalia BilisKevin Campos, MD  Pediatric Multivit-Minerals-C (FLINTSTONES GUMMIES PO) Take 1 tablet by mouth daily. Reported on 05/14/2016    Historical Provider, MD   BP 115/78 mmHg  Pulse 76  Temp(Src) 98.6 F (37 C) (Oral)  Resp 16  Ht 5' (1.524 m)  Wt 190 lb (86.183 kg)  BMI  37.11 kg/m2  SpO2 98%  LMP 03/13/2016 Physical Exam  Constitutional: She is oriented to person, place, and time. She appears well-developed.  HENT:  Head: Normocephalic.  Eyes: Conjunctivae and EOM are normal. No scleral icterus.  Neck: Neck supple. No thyromegaly present.  Cardiovascular: Normal rate and regular rhythm.  Exam reveals no gallop and no friction rub.   No murmur heard. Pulmonary/Chest: No stridor. She has no wheezes. She has no rales. She exhibits no tenderness.  Abdominal: She exhibits no distension. There is tenderness. There is no rebound.  Mild right upper quadrant tenderness  Musculoskeletal: Normal range of motion. She exhibits no edema.  Mild/moderate right flank tenderness  Lymphadenopathy:    She has no cervical adenopathy.  Neurological: She is oriented to person, place, and time. She exhibits normal muscle tone. Coordination normal.  Skin: No rash noted. No erythema.  Psychiatric: She has a normal mood and affect. Her behavior is normal.    ED Course  Procedures (including critical care time) Labs Review Labs Reviewed  URINALYSIS, ROUTINE W REFLEX MICROSCOPIC (NOT AT [facility]) - Abnormal; Notable for the following:    Hgb urine dipstick TRACE (*)    Leukocytes, UA SMALL (*)    All other components within normal limits  URINE MICROSCOPIC-ADD ON - Abnormal; Notable for the following:    Squamous Epithelial / LPF TOO NUMEROUS TO COUNT (*)    Bacteria, UA FEW (*)    All other components within normal limits  CBC WITH DIFFERENTIAL/PLATELET - Abnormal; Notable for the following:    MCH 25.7 (*)    All other components within normal limits  I-STAT BETA HCG BLOOD, ED ([facility], [facility], [facility] ONLY) - Abnormal; Notable for the following:    I-stat hCG, quantitative >2000.0 (*)    All other components within normal limits  COMPREHENSIVE METABOLIC PANEL    Imaging Review [place] Abdomen Complete  05/14/2016  CLINICAL DATA:  Right flank pain x 3-4 days, dysuria EXAM: ABDOMEN  ULTRASOUND COMPLETE COMPARISON:  None. FINDINGS: Gallbladder: No gallstones, gallbladder wall thickening, or pericholecystic fluid. Negative sonographic Murphy's sign. Common bile duct: Diameter: 3 mm Liver: Hyperechoic hepatic parenchyma. No focal hepatic lesion is seen. IVC: No abnormality visualized. Pancreas: Visualized portion unremarkable. Spleen: Size and appearance within normal limits. Right Kidney: Length: 10.6 cm.  No mass or hydronephrosis. Left Kidney: Length: 10.9 cm.  No mass or hydronephrosis. Abdominal aorta: No aneurysm visualized. Other findings: None. IMPRESSION: Hyperechoic hepatic parenchyma, raising the possibility of hepatic steatosis. Otherwise negative abdominal ultrasound. Electronically Signed   By: Charline BillsSriyesh  Krishnan M.D.   On: 05/14/2016 11:52   [place] Ob Comp Less 14 Wks  05/14/2016  CLINICAL DATA:  First trimester pregnancy, right abdominal pain EXAM: OBSTETRIC <14 WK US AND TRANSVAGINAL OB US TECHNIQUE: Both transabdominal and transvaginal ultrasound examinations were performed for complete evaluation of the gestation as well as the maternal uterus,  adnexal regions, and pelvic cul-de-sac. Transvaginal technique was performed to assess early pregnancy. COMPARISON:  None. FINDINGS: Intrauterine gestational sac: Single gestational sac identified Yolk sac:  Present Embryo:  Present Cardiac Activity: Visualized Heart Rate: 178  bpm CRL:  39.7  mm   9 w   3 d                  [place] EDC: 12/15/2016 Subchorionic hemorrhage:  None Maternal uterus/adnexae: Ovaries are normal.  No free fluid. IMPRESSION: Normal findings associated with first trimester intrauterine gestation Electronically Signed   By: Esperanza Heir M.D.   On: 05/14/2016 11:54   US Ob Transvaginal  05/14/2016  CLINICAL DATA:  First trimester pregnancy, right abdominal pain EXAM: OBSTETRIC <14 WK [place] AND TRANSVAGINAL OB US TECHNIQUE: Both transabdominal and transvaginal ultrasound examinations were performed for complete evaluation  of the gestation as well as the maternal uterus, adnexal regions, and pelvic cul-de-sac. Transvaginal technique was performed to assess early pregnancy. COMPARISON:  None. FINDINGS: Intrauterine gestational sac: Single gestational sac identified Yolk sac:  Present Embryo:  Present Cardiac Activity: Visualized Heart Rate: 178  bpm CRL:  39.7  mm   9 w   3 d                  [place] EDC: 12/15/2016 Subchorionic hemorrhage:  None Maternal uterus/adnexae: Ovaries are normal.  No free fluid. IMPRESSION: Normal findings associated with first trimester intrauterine gestation Electronically Signed   By: Esperanza Heir M.D.   On: 05/14/2016 11:54   I have personally reviewed and evaluated these images and lab results as part of my medical decision-making.   EKG Interpretation None      MDM   Final diagnoses:  IUP (intrauterine pregnancy), incidental    Patient with flank pain and abdominal discomfort labs and urine unremarkable except for positive pregnancy test. Patient ultrasound of abdomen and pelvis. Abdomen ultrasound unremarkable pelvis ultrasound shows intrauterine pregnancy 9 weeks 3 days. Patient's discomfort has improved prior to discharge. Patient will follow up with OB/GYN next week and take Tylenol for pain    Bethann Berkshire, MD 05/14/16 1241

## 2016-05-14 NOTE — ED Notes (Signed)
Patient with no complaints at this time. Respirations even and unlabored. Skin warm/dry. Discharge instructions reviewed with patient at this time. Patient given opportunity to voice concerns/ask questions. Patient discharged at this time and left Emergency Department with steady gait.   

## 2016-05-14 NOTE — Discharge Instructions (Signed)
Follow-up with Dr. Despina HiddenEURE next week or follow-up with another OB/GYN doctor. [facility] if symptoms get worse. Tylenol for pain

## 2016-05-25 ENCOUNTER — Emergency Department (HOSPITAL_COMMUNITY)
Admission: EM | Admit: 2016-05-25 | Discharge: 2016-05-25 | Disposition: A | Discharge status: Other Acute Inpatient Hospital | Payer: Medicaid Other | Attending: Emergency Medicine | Admitting: Emergency Medicine

## 2016-05-25 ENCOUNTER — Encounter (HOSPITAL_COMMUNITY): Payer: Self-pay | Admitting: *Deleted

## 2016-05-25 ENCOUNTER — Inpatient Hospital Stay (HOSPITAL_COMMUNITY)
Admission: RE | Admit: 2016-05-25 | Discharge: 2016-05-27 | DRG: 885 | Disposition: A | Discharge status: Home / Self Care | Payer: No Typology Code available for payment source | Attending: Psychiatry | Admitting: Psychiatry

## 2016-05-25 DIAGNOSIS — Z331 Pregnant state, incidental: Secondary | ICD-10-CM | POA: Diagnosis present

## 2016-05-25 DIAGNOSIS — F332 Major depressive disorder, recurrent severe without psychotic features: Principal | ICD-10-CM | POA: Diagnosis present

## 2016-05-25 DIAGNOSIS — Z87891 Personal history of nicotine dependence: Secondary | ICD-10-CM | POA: Diagnosis not present

## 2016-05-25 DIAGNOSIS — F419 Anxiety disorder, unspecified: Secondary | ICD-10-CM | POA: Diagnosis present

## 2016-05-25 DIAGNOSIS — G4700 Insomnia, unspecified: Secondary | ICD-10-CM | POA: Diagnosis present

## 2016-05-25 DIAGNOSIS — Z3A10 10 weeks gestation of pregnancy: Secondary | ICD-10-CM | POA: Diagnosis not present

## 2016-05-25 DIAGNOSIS — F329 Major depressive disorder, single episode, unspecified: Secondary | ICD-10-CM

## 2016-05-25 DIAGNOSIS — Z823 Family history of stroke: Secondary | ICD-10-CM

## 2016-05-25 DIAGNOSIS — Z833 Family history of diabetes mellitus: Secondary | ICD-10-CM | POA: Diagnosis not present

## 2016-05-25 DIAGNOSIS — R45851 Suicidal ideations: Secondary | ICD-10-CM | POA: Diagnosis present

## 2016-05-25 DIAGNOSIS — F32A Depression, unspecified: Secondary | ICD-10-CM

## 2016-05-25 DIAGNOSIS — Z8249 Family history of ischemic heart disease and other diseases of the circulatory system: Secondary | ICD-10-CM | POA: Diagnosis not present

## 2016-05-25 DIAGNOSIS — Z62810 Personal history of physical and sexual abuse in childhood: Secondary | ICD-10-CM | POA: Diagnosis present

## 2016-05-25 DIAGNOSIS — O26891 Other specified pregnancy related conditions, first trimester: Secondary | ICD-10-CM | POA: Insufficient documentation

## 2016-05-25 DIAGNOSIS — J45909 Unspecified asthma, uncomplicated: Secondary | ICD-10-CM | POA: Diagnosis not present

## 2016-05-25 DIAGNOSIS — Z3490 Encounter for supervision of normal pregnancy, unspecified, unspecified trimester: Secondary | ICD-10-CM

## 2016-05-25 LAB — ACETAMINOPHEN LEVEL: Acetaminophen (Tylenol), Serum: <10 ug/mL — ABNORMAL LOW (ref 10–30)

## 2016-05-25 LAB — I-STAT BETA HCG BLOOD, ED (MC, WL, AP ONLY): I-stat hCG, quantitative: >2000 m[IU]/mL — ABNORMAL HIGH (ref <5)

## 2016-05-25 LAB — ETHANOL

## 2016-05-25 LAB — SALICYLATE LEVEL: Salicylate Lvl: <4 mg/dL (ref 2.8–30.0)

## 2016-05-25 MED ORDER — IBUPROFEN 200 MG PO TABS: 600.0000 mg | ORAL_TABLET | Freq: Three times a day (TID) | ORAL | Status: DC | PRN

## 2016-05-25 MED ORDER — ONDANSETRON HCL 4 MG PO TABS: 4.0000 mg | ORAL_TABLET | Freq: Three times a day (TID) | ORAL | Status: DC | PRN

## 2016-05-25 MED ORDER — LORAZEPAM 1 MG PO TABS: 1.0000 mg | ORAL_TABLET | Freq: Three times a day (TID) | ORAL | Status: DC | PRN

## 2016-05-25 MED ORDER — NICOTINE 21 MG/24HR TD PT24: 21.0000 mg | MEDICATED_PATCH | Freq: Every day | TRANSDERMAL | Status: DC

## 2016-05-25 MED ORDER — ZOLPIDEM TARTRATE 5 MG PO TABS: 5.0000 mg | ORAL_TABLET | Freq: Every evening | ORAL | Status: DC | PRN

## 2016-05-25 MED ORDER — ALUM & MAG HYDROXIDE-SIMETH 200-200-20 MG/5ML PO SUSP: 30.0000 mL | ORAL | Status: DC | PRN

## 2016-05-25 MED ORDER — ACETAMINOPHEN 325 MG PO TABS: 650.0000 mg | ORAL_TABLET | ORAL | Status: DC | PRN

## 2016-05-25 NOTE — ED Provider Notes (Addendum)
CSN: [id number]     Arrival date & time 05/25/16  1751 History   First MD Initiated Contact with Patient 05/25/16 1832     Chief Complaint  Patient presents with  . Medical Clearance  . Suicidal     (Consider location/radiation/quality/duration/timing/severity/associated sxs/prior Treatment) HPI...Marland KitchenMarland KitchenLevel V caveat for psychiatric illness.  Patient is depressed with some suicidal ideation. She is enrolled in school but not attending classes. She stays in bed all day. She has had depression in the past and has PTSD. She is currently pregnant. No specific plan for suicide.  Past Medical History  Diagnosis Date  . Urinary tract infection   . Asthma     childhood  . Anxiety   . Depression     No treatment, being monitored by Dr. Emelda Fear  . Chlamydia   . Herpes simplex without mention of complication   . Pregnant 01/03/2014  . Costochondral chest pain 01/03/2014  . Miscarriage 02/01/2014   Past Surgical History  Procedure Laterality Date  . Therapeutic abortion    . Cesarean section    . Cesarean section  11/16/2012    Procedure: CESAREAN SECTION;  Surgeon: Catalina Antigua, MD;  Location: [facility];  Service: Obstetrics;  Laterality: N/A;   Family History  Problem Relation Age of Onset  . Other Neg Hx   . Cancer Other     Breast, ovarian  . Diabetes Other   . Hypertension Other   . Stroke Other   . Hypertension Mother   . Heart disease Father   . Hypertension Father   . Heart disease Sister   . Liver disease Paternal Aunt   . Hypertension Maternal Grandfather   . Hypertension Paternal Grandmother   . Diabetes Paternal Grandmother   . Kidney disease Paternal Grandmother   . Liver disease Paternal Grandmother    Social History  Substance Use Topics  . Smoking status: Former Smoker    Types: Cigarettes  . Smokeless tobacco: Never Used  . Alcohol Use: No   OB History    Gravida Para Term Preterm AB TAB SAB Ectopic Multiple Living   0 1 1 0 0  2     Review of  Systems  Reason unable to perform ROS: Psychiatric illness.      Allergies  Other and Penicillins  Home Medications   Prior to Admission medications   Medication Sig Start Date End Date Taking? Authorizing Provider  acetaminophen (TYLENOL) 500 MG tablet Take 1,000 mg by mouth every 6 (six) hours as needed.    Historical Provider, MD  cetirizine (ZYRTEC) 10 MG tablet Take 10 mg by mouth daily.    Historical Provider, MD  lisinopril (PRINIVIL,ZESTRIL) 5 MG tablet Take 5 mg by mouth daily.    Historical Provider, MD  medroxyPROGESTERone (DEPO-PROVERA) 150 MG/ML injection Inject 150 mg into the muscle every 3 (three) months.    Historical Provider, MD  omeprazole (PRILOSEC) 20 MG capsule Take 1 capsule (20 mg total) by mouth daily. 03/07/16   Azalia Bilis, MD  ondansetron (ZOFRAN ODT) 8 MG disintegrating tablet Take 1 tablet (8 mg total) by mouth every 8 (eight) hours as needed for nausea or vomiting. Patient not taking: Reported on 05/14/2016 03/07/16   Azalia Bilis, MD  Pediatric Multivit-Minerals-C (FLINTSTONES GUMMIES PO) Take 1 tablet by mouth daily. Reported on 05/14/2016    Historical Provider, MD   BP 126/84 mmHg  Pulse 95  Temp(Src) 98.9 F (37.2 C) (Oral)  Resp 19  SpO2 100%  LMP 03/13/2016 Physical Exam  Constitutional: She is oriented to person, place, and time.  Obese  HENT:  Head: Normocephalic and atraumatic.  Eyes: Conjunctivae and EOM are normal. Pupils are equal, round, and reactive to light.  Neck: Normal range of motion. Neck supple.  Cardiovascular: Normal rate and regular rhythm.   Pulmonary/Chest: Effort normal and breath sounds normal.  Abdominal: Soft. Bowel sounds are normal.  Musculoskeletal: Normal range of motion.  Neurological: She is alert and oriented to person, place, and time.  Skin: Skin is warm and dry.  Psychiatric:  Flat affect, depressed  Nursing note and vitals reviewed.   ED Course  Procedures (including critical care time) Labs  Review Labs Reviewed  ACETAMINOPHEN LEVEL - Abnormal; Notable for the following:    Acetaminophen (Tylenol), Serum <10 (*)    All other components within normal limits  I-STAT BETA HCG BLOOD, ED ([facility], [facility], [facility] ONLY) - Abnormal; Notable for the following:    I-stat hCG, quantitative >2000.0 (*)    All other components within normal limits  ETHANOL  SALICYLATE LEVEL  COMPREHENSIVE METABOLIC PANEL  CBC  URINE RAPID DRUG SCREEN, HOSP PERFORMED    Imaging Review No results found. I have personally reviewed and evaluated these images and lab results as part of my medical decision-making.   EKG Interpretation None      MDM   Final diagnoses:  Depression  Pregnant    We'll obtain [facility] consult.   Donnetta HutchingBrian Jaggar Benko, MD 05/25/16 Malvin Johns2008  Donnetta HutchingBrian Zymier Rodgers, MD 05/29/16 [id number]

## 2016-05-25 NOTE — ED Notes (Signed)
Pt presents with SI x 1 day, no plan.  Pt denies at present..  Pt reports feeling overwhelmed, [redacted] wks pregnant with children at home, ages 304 and 263 yrs old.  Children with husband at present.  Pt reports she has been feeling anxious and depressed for the past 2-3 mos.  Pt reports her husband is supportive and she attends [employer].  Pt reports she was taking Sertraline and the med stopped working so she discontinued it.  Denies HI or AVH.  Pt stating she does not want to be admitted at present this is the beginning of the semester at [employer] and if she misses 3 days they will drop her from the classes.  AAO x 3, no distress noted, calm & cooperative, interactive with staff.  Monitoring for safety, Q 15 min checks in effect.

## 2016-05-25 NOTE — Tx Team (Signed)
Initial Interdisciplinary Treatment Plan   PATIENT STRESSORS: Educational concerns Financial difficulties Loss of Grandmother Marital or family conflict Medication change or noncompliance Traumatic event   PATIENT STRENGTHS: Ability for insight Average or above average intelligence Capable of independent living Communication skills Motivation for treatment/growth Physical Health Supportive family/friends   PROBLEM LIST: Problem List/Patient Goals Date to be addressed Date deferred Reason deferred Estimated date of resolution  At risk for suicide 05/25/2016  05/25/2016   D/C  Anxiety 05/25/2016  05/25/2016   D/C  Depression 05/25/2016  05/25/2016   D/C  "Getting back on my meds or getting on something to help manage stress level" 05/25/2016  05/25/2016   D/C  "Figure out something to help me express the way I feel so others can understand and I won't get frustrated when they don't understand" 05/25/2016  05/25/2016  D/C                           DISCHARGE CRITERIA:  Adequate post-discharge living arrangements Improved stabilization in mood, thinking, and/or behavior Motivation to continue treatment in a less acute level of care Need for constant or close observation no longer present Verbal commitment to aftercare and medication compliance  PRELIMINARY DISCHARGE PLAN: Outpatient therapy Return to previous living arrangement Return to previous work or school arrangements  PATIENT/FAMIILY INVOLVEMENT: This treatment plan has been presented to and reviewed with the patient, Debra Townsend.  The patient and family have been given the opportunity to ask questions and make suggestions.  Debra Townsend, Debra Townsend 05/25/2016, 11:23 PM

## 2016-05-25 NOTE — ED Notes (Addendum)
[facility] and was sent here for med clearance. Pt admits to SI, no plan. Upon assessment pt sts she really feels just overwhelmed with bills and life, recently found out she is pregnant, she sts she hopes to be seen and evaluated by psychiatrist, and discharged home with something for anxiety and outpatient resources.
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
## 2016-05-25 NOTE — ED Notes (Signed)
Attempted blood draw in right hand and left ac, was unsuccessful

## 2016-05-25 NOTE — BH Assessment (Signed)
Per Vernona RiegerLaura, NP - patient meets criteria for inpatient hospitalization.  [facility].  TTS will seek placement.   Writer informed the Press photographerCharge Nurse, Joycie Peeklina that the patient would be coming for medical clearance.
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
## 2016-05-25 NOTE — ED Notes (Signed)
[facility] staff reports to return call for report.

## 2016-05-25 NOTE — Progress Notes (Signed)
[facility] Dr. Elna BreslowEappen.  Call report [phone].  Patient can be transferred after 8:30pm for admission.      Debra Townsend, MSW, Clare CharonLCSW, LCAS [facility] Triage Specialist [phone]

## 2016-05-25 NOTE — ED Notes (Signed)
[facility] RN [facility] Herbreka[facility]  Pending [facility] transport in 30 min.

## 2016-05-26 ENCOUNTER — Encounter (HOSPITAL_COMMUNITY): Payer: Self-pay | Admitting: Psychiatry

## 2016-05-26 DIAGNOSIS — R45851 Suicidal ideations: Secondary | ICD-10-CM | POA: Diagnosis present

## 2016-05-26 DIAGNOSIS — F332 Major depressive disorder, recurrent severe without psychotic features: Secondary | ICD-10-CM | POA: Insufficient documentation

## 2016-05-26 MED ORDER — COMPLETENATE 29-1 MG PO CHEW
1.0000 | CHEWABLE_TABLET | Freq: Every day | ORAL | Status: DC
  Administered 2016-05-26: 1 via ORAL
  Filled 2016-05-26 (×2): qty 1

## 2016-05-26 MED ORDER — ALUM & MAG HYDROXIDE-SIMETH 200-200-20 MG/5ML PO SUSP: 30.0000 mL | ORAL | Status: DC | PRN

## 2016-05-26 MED ORDER — MAGNESIUM HYDROXIDE 400 MG/5ML PO SUSP: 30.0000 mL | Freq: Every day | ORAL | Status: DC | PRN

## 2016-05-26 MED ORDER — ACETAMINOPHEN 325 MG PO TABS
650.0000 mg | ORAL_TABLET | Freq: Four times a day (QID) | ORAL | Status: DC | PRN
  Administered 2016-05-26 – 2016-05-27 (×4): 650 mg via ORAL
  Filled 2016-05-26 (×4): qty 2

## 2016-05-26 MED ORDER — DIPHENHYDRAMINE HCL 25 MG PO CAPS: 50.0000 mg | ORAL_CAPSULE | Freq: Every evening | ORAL | Status: DC | PRN

## 2016-05-26 NOTE — [facility] Group Notes (Signed)
[facility] LCSW Group Therapy 05/26/2016 1:15 PM  Type of Therapy: Group Therapy- Emotion Regulation  Participation Level: Active   Participation Quality:  Appropriate  Affect: Appropriate  Cognitive: Alert and Oriented   Insight:  Developing/Improving  Engagement in Therapy: Developing/Improving and Engaged   Modes of Intervention: Clarification, Confrontation, Discussion, Education, Exploration, Limit-setting, Orientation, Problem-solving, Rapport Building, Dance movement psychotherapisteality Testing, Socialization and Support  Summary of Progress/Problems: The topic for group today was emotional regulation. This group focused on both positive and negative emotion identification and allowed group members to process ways to identify feelings, regulate negative emotions, and find healthy ways to manage internal/external emotions. Group members were asked to reflect on a time when their reaction to an emotion led to a negative outcome and explored how alternative responses using emotion regulation would have benefited them. Group members were also asked to discuss a time when emotion regulation was utilized when a negative emotion was experienced. Pt was able to identify irrational thinking as it applies to her anxiety. She reports that she feels that she has been improving on her level of awareness as it relates to her anger and anxiety. Pt is hopeful that this progress will continue.   Debra CordialLauren Townsend, LCSWA 05/26/2016 2:48 PM

## 2016-05-26 NOTE — Plan of Care (Signed)
Problem: Education: Goal: Utilization of techniques to improve thought processes will improve Outcome: Progressing Nurse discussed depression/coping skills with patient.    

## 2016-05-26 NOTE — Progress Notes (Signed)
GC/Chlamydia urine sample ordered by Juel Burrowakia, S., Np. Writer attempted to collect sample from pt but pt refused. Pt stated that she is unaware of why she needed to be screened for a STD's. Per pt, she's asymptomatic. Writer spoke with Nicole KindredAgnes, NP., who gave a verbal order to d/c GC/Chlamydia sample.
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
## 2016-05-26 NOTE — Progress Notes (Signed)
Recreation Therapy Notes  Date: 06.14.2017 Time: 9:30am Location: [address] Group Room   Group Topic: Stress Management  Goal Area(s) Addresses:  Patient will actively participate in stress management techniques presented during session.   Behavioral Response: Did not attend.   Marykay Lexenise L Jolan Upchurch, LRT/CTRS        Kennedi Lizardo L 05/26/2016 1:48 PM
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
## 2016-05-26 NOTE — Progress Notes (Addendum)
D:  Patient's self inventory sheet, patient has fair sleep, no sleep medication given.  Poor appetite, normal energy level, good concentration.  Rated depression 3, denied hopeless, anxiety #4.  Denied withdrawals.  Denied SI.  Denied physical problems.  Denied pain.  Goal is discharge to her children.  Plans to be cooperative.  Does have discharge plans. A:  Medications administered per MD orders.  Emotional support and encouragement given patient. R:  Denied SI and HI, contracts for safety.  Denied A/V hallucinations.  Safety maintained with 15 minute checks.  Patient's dad came to discuss his daughter's progress with MD today.  Patient in meeting with her dad and MD.
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
## 2016-05-26 NOTE — [facility] Group Notes (Signed)
[facility] LCSW Aftercare Discharge Planning Group Note  05/26/2016 8:45 AM  Participation Quality: Alert, Appropriate and Oriented  Mood/Affect: Flat  Depression Rating: 0  Anxiety Rating: "I have a little bit"  Thoughts of Suicide: Pt denies SI/HI  Will you contract for safety? Yes  Current AVH: Pt denies  Plan for Discharge/Comments: Pt attended discharge planning group and actively participated in group. CSW discussed suicide prevention education with the group and encouraged them to discuss discharge planning and any relevant barriers. Pt reports that she is feeling "okay" this morning and is trying to get used to the unit.   Transportation Means: Pt reports access to transportation  Supports: No supports mentioned at this time  Chad CordialLauren Carter, LCSWA 05/26/2016 10:02 AM

## 2016-05-26 NOTE — Tx Team (Addendum)
Interdisciplinary Treatment Plan Update (Adult) Date: 05/26/2016   Date: 05/26/2016 11:12 AM  Progress in Treatment:  Attending groups: Debra Townsend is new to milieu, continuing to assess  Participating in groups: Debra Townsend is new to milieu, continuing to assess  Taking medication as prescribed: Yes  Tolerating medication: Yes  Family/Significant othe contact made: Yes with husband Patient understands diagnosis: Continuing to assess Discussing patient identified problems/goals with staff: Yes  Medical problems stabilized or resolved: Yes  Denies suicidal/homicidal ideation: Yes Patient has not harmed self or Others: Yes   New problem(s) identified: None identified at this time.   Discharge Plan or Barriers: Debra Townsend will return home and follow-up with outpatient resources  Additional comments:  Patient and CSW reviewed Debra Townsend's identified goals and treatment plan. Patient verbalized understanding and agreed to treatment plan.   Reason for Continuation of Hospitalization:  Anxiety Depression Medication stabilization Suicidal ideation  Estimated length of stay: 2-4 days  Review of initial/current patient goals per problem list:   1.  Goal(s): Patient will participate in aftercare plan  Met:  Yes  Target date: 3-5 days from date of admission   As evidenced by: Patient will participate within aftercare plan AEB aftercare provider and housing plan at discharge being identified.   05/26/16: Debra Townsend will return home and follow-up with outpatient resources  2.  Goal (s): Patient will exhibit decreased depressive symptoms and suicidal ideations.  Met:  Yes  Target date: 3-5 days from date of admission   As evidenced by: Patient will utilize self rating of depression at 3 or below and demonstrate decreased signs of depression or be deemed stable for discharge by MD.  05/26/16: Debra Townsend rates depression at 0/10; denies SI  3.  Goal(s): Patient will demonstrate decreased signs and symptoms of anxiety.  Met:   Progressing  Target date: 3-5 days from date of admission   As evidenced by: Patient will utilize self rating of anxiety at 3 or below and demonstrated decreased signs of anxiety, or be deemed stable for discharge by MD  05/26/16: Debra Townsend continues to report having "some" anxiety  Attendees:  Patient:    Family:    Physician: Dr. Parke Poisson, MD  05/26/2016 11:12 AM  Nursing: Grayland Ormond, RN 05/26/2016 11:12 AM  Clinical Social Worker Peri Maris, [place] 05/26/2016 11:12 AM  Other: Erasmo Downer Drinkard, LCSWA 05/26/2016 11:12 AM  Clinical: Lars Pinks, RN Case manager  05/26/2016 11:12 AM  Other:  05/26/2016 11:12 AM  Other:     Peri Maris, [place] Work [phone]

## 2016-05-27 ENCOUNTER — Encounter: Payer: Self-pay | Admitting: Obstetrics and Gynecology

## 2016-05-27 ENCOUNTER — Ambulatory Visit (INDEPENDENT_AMBULATORY_CARE_PROVIDER_SITE_OTHER): Payer: Self-pay | Admitting: Obstetrics and Gynecology

## 2016-05-27 VITALS — BP 120/82 | HR 107 | Ht 60.0 in

## 2016-05-27 DIAGNOSIS — Z331 Pregnant state, incidental: Secondary | ICD-10-CM

## 2016-05-27 DIAGNOSIS — Z1389 Encounter for screening for other disorder: Secondary | ICD-10-CM

## 2016-05-27 DIAGNOSIS — R102 Pelvic and perineal pain: Secondary | ICD-10-CM

## 2016-05-27 LAB — POCT URINALYSIS DIPSTICK
Blood, UA: NEGATIVE
Glucose, UA: NEGATIVE
KETONES UA: NEGATIVE
LEUKOCYTES UA: NEGATIVE
Nitrite, UA: NEGATIVE
PROTEIN UA: NEGATIVE

## 2016-05-27 MED ORDER — COMPLETENATE 29-1 MG PO CHEW: 1.0000 | CHEWABLE_TABLET | Freq: Every day | ORAL | Status: DC

## 2016-05-27 MED ORDER — COMPLETENATE 29-1 MG PO CHEW
1.0000 | CHEWABLE_TABLET | Freq: Every day | ORAL | Status: DC
  Filled 2016-05-27 (×2): qty 1
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
## 2016-05-27 NOTE — Discharge Summary (Signed)
Physician Discharge Summary Note  Patient:  Debra Townsend is an 25 y.o., female MRN:  [id number] DOB:  [birth year] Patient phone:  [phone] (home)  Patient address:   [address] [place] [address],  Total Time spent with patient: 30 minutes  Date of Admission:  05/25/2016 Date of Discharge: 05/27/2016  Reason for Admission:    Principal Problem: Suicidal ideations Discharge Diagnoses: Patient Active Problem List   Diagnosis Date Noted  . MDD (major depressive disorder), recurrent severe, without psychosis (HCC) [F33.2] 05/26/2016  . Suicidal ideations [R45.851]   . Miscarriage [O03.9] 02/01/2014  . Pregnant [Z33.1] 01/03/2014  . Costochondral chest pain [R07.1] 01/03/2014  . 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 Chlamydia [A74.9] 02/20/2013  . Mild or unspecified pre-eclampsia, postpartum condition or complication [IMO0002] 11/21/2012    Past Psychiatric History: see HPI  Past Medical History:  Past Medical History  Diagnosis Date  . Urinary tract infection   . Asthma     childhood  . Anxiety   . Depression     No treatment, being monitored by Dr. Emelda Fear  . Chlamydia   . Herpes simplex without mention of complication   . Pregnant 01/03/2014  . Costochondral chest pain 01/03/2014  . Miscarriage 02/01/2014    Past Surgical History  Procedure Laterality Date  . Therapeutic abortion    . Cesarean section    . Cesarean section  11/16/2012    Procedure: CESAREAN SECTION;  Surgeon: Catalina Antigua, MD;  Location: [facility];  Service: Obstetrics;  Laterality: N/A;   Family History:  Family History  Problem Relation Age of Onset  . Other Neg Hx   . Cancer Other     Breast, ovarian  . Diabetes Other   . Hypertension Other   . Stroke Other   . Hypertension Mother   . Heart disease Father   . Hypertension Father   . Heart disease Sister   . Liver disease Paternal Aunt   . Hypertension Maternal Grandfather   . Hypertension Paternal Grandmother    . Diabetes Paternal Grandmother   . Kidney disease Paternal Grandmother   . Liver disease Paternal Grandmother    Family Psychiatric  History:  See HPI Social History:  History  Alcohol Use No     History  Drug Use No    Comment: Last used 2 months ago    Social History   Social History  . Marital Status: Married    Spouse Name: N/A  . Number of Children: N/A  . Years of Education: N/A   Social History Main Topics  . Smoking status: Former Smoker    Types: Cigarettes  . Smokeless tobacco: Never Used  . Alcohol Use: No  . Drug Use: No     Comment: Last used 2 months ago  . Sexual Activity: Yes    Birth Control/ Protection: None   Other Topics Concern  . None   Social History Narrative    Hospital Course:  Debra Townsend, 25 y.o. female that reported SI without a plan. Patient reported increased racing thoughts of killing herself.   Debra Townsend was admitted for Suicidal ideations and crisis management.  Due to pregnancy was limited use of SSRI at this time. Benadryl for anxiety, insomnia.  Prenatal vitamin.  Medical problems were identified and treated as needed.  Home medications were restarted as appropriate.  Improvement was monitored by observation and Debra Townsend daily report of symptom reduction.  Emotional and mental status was monitored  by daily self inventory reports completed by Debra Townsend and clinical staff.  Patient reported continued improvement, denied any new concerns.  Patient had been compliant on medications and denied side effects.  Support and encouragement was provided.    Patient did well during inpatient stay.  At time of discharge, patient rated both depression and anxiety levels to be manageable and minimal.  Patient encouraged to attend groups to help with recognizing triggers of emotional crises and de-stabilizations.  Patient encouraged to attend group to help identify the positive things in life that would help in dealing with feelings of  loss, depression and unhealthy or abusive tendencies.         Debra Townsend was evaluated by the treatment team for stability and plans for continued recovery upon discharge.  She was offered further treatment options upon discharge including Residential, Intensive Outpatient and Outpatient treatment.  She will follow up with agencies listed below for medication management and counseling.  Encouraged patient to maintain satisfactory support network and home environment.  Advised to adhere to medication compliance and outpatient treatment follow up.      Debra Townsend motivation was an integral factor for scheduling further treatment.  Employment, transportation, bed availability, health status, family support, and any pending legal issues were also considered during her hospital stay.  Upon completion of this admission the patient was both mentally and medically stable for discharge denying suicidal/homicidal ideation, auditory/visual/tactile hallucinations, delusional thoughts and paranoia.       Physical Findings: AIMS: Facial and Oral Movements Muscles of Facial Expression: None, normal Lips and Perioral Area: None, normal Jaw: None, normal Tongue: None, normal,Extremity Movements Upper (arms, wrists, hands, fingers): None, normal Lower (legs, knees, ankles, toes): None, normal, Trunk Movements Neck, shoulders, hips: None, normal, Overall Severity Severity of abnormal movements (highest score from questions above): None, normal Incapacitation due to abnormal movements: None, normal Patient's awareness of abnormal movements (rate only patient's report): No Awareness, Dental Status Current problems with teeth and/or dentures?: No Does patient usually wear dentures?: No  CIWA:  CIWA-Ar Total: 1 COWS:  COWS Total Score: 2  Musculoskeletal: Strength & Muscle Tone: within normal limits Gait & Station: normal Patient leans: N/A  Psychiatric Specialty Exam: Physical Exam  Vitals  reviewed. Psychiatric: Her mood appears not anxious. Her affect is not angry. Thought content is not paranoid. She does not exhibit a depressed mood. She expresses no homicidal and no suicidal ideation.    Review of Systems  Psychiatric/Behavioral: Negative for depression, suicidal ideas and substance abuse. The patient is not nervous/anxious.   All other systems reviewed and are negative.   Blood pressure 99/73, pulse 113, temperature 98.9 F (37.2 C), temperature source Oral, resp. rate 18, height 5' (1.524 m), weight 85.276 kg (188 lb), last menstrual period 03/13/2016, SpO2 100 %, unknown if currently breastfeeding.Body mass index is 36.72 kg/(m^2).   Have you used any form of tobacco in the last 30 days? (Cigarettes, Smokeless Tobacco, Cigars, and/or Pipes): No  Has this patient used any form of tobacco in the last 30 days? (Cigarettes, Smokeless Tobacco, Cigars, and/or Pipes) Yes, N/A  Blood Alcohol level:  Lab Results  Component Value Date   ETH <5 05/25/2016    Metabolic Disorder Labs:  No results found for: HGBA1C, MPG No results found for: PROLACTIN No results found for: CHOL, TRIG, HDL, CHOLHDL, VLDL, LDLCALC  See Psychiatric Specialty Exam and Suicide Risk Assessment completed by Attending Physician prior to discharge.  Discharge destination:  Home  Is patient on multiple antipsychotic therapies at discharge:  No   Has Patient had three or more failed trials of antipsychotic monotherapy by history:  No  Recommended Plan for Multiple Antipsychotic Therapies: NA     Medication List    STOP taking these medications        acetaminophen 500 MG tablet  Commonly known as:  TYLENOL     cetirizine 10 MG tablet  Commonly known as:  ZYRTEC     FLINTSTONES GUMMIES PO     lisinopril 5 MG tablet  Commonly known as:  PRINIVIL,ZESTRIL     medroxyPROGESTERone 150 MG/ML injection  Commonly known as:  DEPO-PROVERA     omeprazole 20 MG capsule  Commonly known as:   PRILOSEC     ondansetron 8 MG disintegrating tablet  Commonly known as:  ZOFRAN ODT      TAKE these medications      Indication   prenatal vitamin w/FE, FA 29-1 MG Chew chewable tablet  Chew 1 tablet by mouth daily at 12 noon.   Indication:  Pregnancy           Follow-up Information    Follow up with [facility] On 06/01/2016.   Why:  at 3:00pm for therapy with Jasmine DecemberSharon.   Contact information:   [facility] [address]  [address]  [place], [place] [address] PHONE: [phone] FAX: [phone]      Follow-up recommendations:  Activity:  as tol Diet:  as tol  Comments:  1.  Take all your medications as prescribed.   2.  Report any adverse side effects to outpatient provider. 3.  Patient instructed to not use alcohol or illegal drugs while on prescription medicines. 4.  In the event of worsening symptoms, instructed patient to call 911, the crisis hotline or go to nearest emergency room for evaluation of symptoms.  Signed: Lindwood QuaSheila May Agustin, NP [facility] 05/27/2016, 2:04 PM  Patient seen, Suicide Assessment Completed.  Disposition Plan Reviewed

## 2016-05-27 NOTE — Progress Notes (Signed)
MD informed of patient's father's request for a written letter.  Nurse attempted to call patient at [phone] to ask patient to call [facility] at [phone] and discuss this matter. No one answered patient's phone.  2nd shift informed of this information which was discussed with charge nurse.

## 2016-05-27 NOTE — Progress Notes (Signed)
Patient ID: Debra Townsend, female   DOB: August 23, 1991, 23 y.o.   MRN: [id number] Pt worked in today for pain and shortness of breath. Pt states that she has been in the hospital for three days and to the ER for the pain and SOB and has been told that it is pregnancy related.
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
## 2016-05-27 NOTE — [facility] Suicide Risk Assessment (Signed)
[facility] INPATIENT:  Family/Significant Other Suicide Prevention Education  Suicide Prevention Education:  Education Completed; Marjie SkiffLafontia Haser, Pt's husband [phone], has been identified by the patient as the family member/significant other with whom the patient will be residing, and identified as the person(s) who will aid the patient in the event of a mental health crisis (suicidal ideations/suicide attempt).  With written consent from the patient, the family member/significant other has been provided the following suicide prevention education, prior to the and/or following the discharge of the patient.  The suicide prevention education provided includes the following:  Suicide risk factors  Suicide prevention and interventions  National Suicide Hotline telephone number  [facility] assessment telephone number  [facility] Emergency Assistance 911  [facility] and/or Residential Mobile Crisis Unit telephone number  Request made of family/significant other to:  Remove weapons (e.g., guns, rifles, knives), all items previously/currently identified as safety concern.    Remove drugs/medications (over-the-counter, prescriptions, illicit drugs), all items previously/currently identified as a safety concern.  The family member/significant other verbalizes understanding of the suicide prevention education information provided.  The family member/significant other agrees to remove the items of safety concern listed above.  Elaina HoopsCarter, Diar Berkel M 05/27/2016, 11:59 AM

## 2016-05-27 NOTE — Progress Notes (Signed)
  [facility] Discharge Plan :  Will you be returning to the same living situation after discharge:  Yes,  pt returning home At discharge, do you have transportation home?: Yes,  pt car is at the hospital Do you have the ability to pay for your medications: Yes,  Pt provided with samples and prescriptions  Release of information consent forms completed and in the chart;  Patient's signature needed at discharge.  Patient to Follow up at: Follow-up Information    Follow up with [facility] On 06/01/2016.   Why:  at 3:00pm for therapy with Jasmine DecemberSharon.   Contact information:   [facility] [address]  [address]  [place], [place] [address] PHONE: [phone] FAX: [phone]      Next level of care provider has access to [facility] Link:no  Safety Planning and Suicide Prevention discussed: Yes,  with husband; see SPE  Have you used any form of tobacco in the last 30 days? (Cigarettes, Smokeless Tobacco, Cigars, and/or Pipes): No  Has patient been referred to the Quitline?: N/A patient is not a smoker  Patient has been referred for addiction treatment: Yes  Elaina HoopsCarter, Gilman Olazabal M 05/27/2016, 12:06 PM

## 2016-05-27 NOTE — Progress Notes (Signed)
D:  Patient's self inventory sheet, patient has poor sleep, no sleep medication given.  Good appetite, normal energy level, good concentration.  Denied depression and hopeless, rated anxiety #2.  Denied SI.   Worst pain in past 24 hours is #6, no pain medicine needed.  Goal is to discharge.  Plans to be cooperative.  Does have discharge plans. A:  Medications administered per MD orders.  Emotional support and encouragement given patient. R:  Denied SI and HI, contracts for safety.  Denied A/V hallucinations.  Safety maintained with 15 minute checks.  Safety maintained with 15 minute checks.

## 2016-05-27 NOTE — Progress Notes (Signed)
Patient ID: Debra Townsend, female   DOB: 08/30/1991, 25 y.o.   MRN: [id number] Debra Townsend  Patient reports too early for fetal movement, denies any bleeding and no rupture of membranes symptoms or regular contractions. Patient complaints: Patient WORKED IN TODAY for sudden-onset, dull pain on her LUQ radiating to her back. She also complains of associated SOB. Twisting her torso exacerbates her pain. Patient states she has been using a heating pad with mild relief. She denies any recent cough or URI.  Patient states she discovered she was pregnant during a visit to the ED on 05/14/16 for flank pain; at that time, she had a positive pregnancy test and had an estimated GA of [redacted] weeks and 3 days, per chart review. Lab tests were otherwise unremarkable, per chart review. She states she was also hospitalized for anxiety and depression for the past 3 days. She was not given anything besides Benadryl. Patient reports a history of Cesarean sections for 2 children, ages 284 and 23 years old. She states when her depression becomes intolerable, she "shuts down" at school and becomes frustrated with her children.  Blood pressure 120/82, pulse 107, height 5' (1.524 m), last menstrual period 03/13/2016, not currently breastfeeding.  refer to the ob flow sheet for FH and FHR, also BP, Wt, Urine results:notable for negative                                                                    Physical Exam Musculoskeletal: Left anterior ribcage is tender to palpation.  Questions were answered. Assessment: Townsend [id number] @ Unknown    Left anterior rib pain, suspect due to muscle strain.   Depression and anxiety.  Plan:  Continued routine obstetrical care,   Advised use of a rib belt, continue heating pad, massage.   Rx Lexapro.   F/u as scheduled for pnx care    By signing my name below, I, Debra Townsend, attest that this documentation has been prepared  under the direction and in the presence of Debra BurrowJohn Townsend Jaire Pinkham, MD. Electronically Signed: Ronney LionSuzanne Townsend, ED Scribe. 05/27/2016. 3:35 PM.   I personally performed the services described in this documentation, which was SCRIBED in my presence. The recorded information has been reviewed and considered accurate. It has been edited as necessary during review. Debra BurrowFERGUSON,[name], MD

## 2016-05-27 NOTE — Progress Notes (Signed)
Discharge Note:  Patient discharged home.  Patient denied SI and HI.  Denied A/V hallucinations.  Suicide prevention information given and discussed with patient who stated she understood and had no questions.  Patient stated she received all her belongings, clothing, toiletries, misc items, prescriptions, etc.  Patient stated she appreciated all assistance received from [facility] staff.  All required discharge information given to patient at discharge.

## 2016-05-28 ENCOUNTER — Telehealth: Payer: Self-pay | Admitting: *Deleted

## 2016-06-01 NOTE — Telephone Encounter (Signed)
Unable to reach by phone

## 2016-06-09 ENCOUNTER — Encounter: Payer: Medicaid Other | Admitting: Women's Health

## 2016-06-10 ENCOUNTER — Encounter (HOSPITAL_COMMUNITY): Payer: Self-pay | Admitting: Emergency Medicine

## 2016-06-10 ENCOUNTER — Ambulatory Visit (HOSPITAL_COMMUNITY)
Admission: EM | Admit: 2016-06-10 | Discharge: 2016-06-10 | Disposition: A | Discharge status: Nursing Home (Includes ICF) | Payer: Medicaid Other | Attending: Emergency Medicine | Admitting: Emergency Medicine

## 2016-06-10 DIAGNOSIS — Z7189 Other specified counseling: Secondary | ICD-10-CM

## 2016-06-10 DIAGNOSIS — Z7901 Long term (current) use of anticoagulants: Secondary | ICD-10-CM

## 2016-06-10 NOTE — ED Notes (Signed)
Attempted to contact [facility] x[facility] for report...advised no nurse available.

## 2016-06-10 NOTE — ED Provider Notes (Signed)
CSN: [id number]     Arrival date & time 06/10/16  1455 History   First MD Initiated Contact with Patient 06/10/16 1543     Chief Complaint  Patient presents with  . Fall   (Consider location/radiation/quality/duration/timing/severity/associated sxs/prior Treatment) HPI History obtained from patient: Location:  head Context/Duration: Fall striking head  Severity: 0  Quality: Timing: Constant           Home Treatment: None Associated symptoms:  None currently taking Lovenox for pulmonary embolus Family History: Diabetes mother    Past Medical History  Diagnosis Date  . Urinary tract infection   . Asthma     childhood  . Anxiety   . Depression     No treatment, being monitored by Dr. Emelda FearFerguson  . Chlamydia   . Herpes simplex without mention of complication   . Pregnant 01/03/2014  . Costochondral chest pain 01/03/2014  . Miscarriage 02/01/2014   Past Surgical History  Procedure Laterality Date  . Therapeutic abortion    . Cesarean section    . Cesarean section  11/16/2012    Procedure: CESAREAN SECTION;  Surgeon: Catalina AntiguaPeggy Constant, MD;  Location: [facility];  Service: Obstetrics;  Laterality: N/A;   Family History  Problem Relation Age of Onset  . Other Neg Hx   . Cancer Other     Breast, ovarian  . Diabetes Other   . Hypertension Other   . Stroke Other   . Hypertension Mother   . Heart disease Father   . Hypertension Father   . Heart disease Sister   . Liver disease Paternal Aunt   . Hypertension Maternal Grandfather   . Hypertension Paternal Grandmother   . Diabetes Paternal Grandmother   . Kidney disease Paternal Grandmother   . Liver disease Paternal Grandmother    Social History  Substance Use Topics  . Smoking status: Former Smoker    Types: Cigarettes  . Smokeless tobacco: Never Used  . Alcohol Use: No   OB History    Gravida Para Term Preterm AB TAB SAB Ectopic Multiple Living   6 2 2  0 1 1 0 0  2     Review of Systems  Denies: HEADACHE, NAUSEA,  ABDOMINAL PAIN, CHEST PAIN, CONGESTION, DYSURIA, SHORTNESS OF BREATH  Allergies  Other and Penicillins  Home Medications   Prior to Admission medications   Medication Sig Start Date End Date Taking? Authorizing Provider  enoxaparin (LOVENOX) 80 MG/0.8ML injection Inject 90 mg into the skin.   Yes Historical Provider, MD   Meds Ordered and Administered this Visit  Medications - No data to display  BP 118/63 mmHg  Pulse 80  Temp(Src) 98.7 F (37.1 C) (Oral)  Resp 18  SpO2 100%  LMP 03/13/2016 No data found.   Physical Exam NURSES NOTES AND VITAL SIGNS REVIEWED. CONSTITUTIONAL: Well developed, well nourished, no acute distress HEENT: normocephalic, atraumatic EYES: Conjunctiva normal NECK:normal ROM, supple, no adenopathy PULMONARY:No respiratory distress, normal effort ABDOMINAL: Soft, ND, NT BS+, No CVAT MUSCULOSKELETAL: Normal ROM of all extremities,  SKIN: warm and dry without rash PSYCHIATRIC: Mood and affect, behavior are normal  ED Course  Procedures (including critical care time)  Labs Review Labs Reviewed - No data to display  Imaging Review No results found.   Visual Acuity Review  Right Eye Distance:   Left Eye Distance:   Bilateral Distance:    Right Eye Near:   Left Eye Near:    Bilateral Near:  MDM   1. Head injury consultation   2. Anticoagulated    Pt is advised that she should be seen in the ER for CT scan as she is on Lovenox.     Tharon AquasFrank C Kindra Bickham, PA 06/10/16 [id number]

## 2016-06-10 NOTE — ED Notes (Signed)
The patient presented to [facility] with a complaint of hitting her head when she fell on some steps today. The patient denied any injury or complaint. The patient stated that she takes Lovenox injections and was advised to be evaluated anytime she falls.

## 2016-06-10 NOTE — Discharge Instructions (Signed)
GO TO EMERGENCY DEPARTMENT FOR CT SCAN

## 2016-06-11 IMAGING — DX DG CHEST 2V
2 series · 2 of 2 positions shown · non-contrast
Comparison: None.

CLINICAL DATA: 24-year-old female with cough and congestion.

EXAM:
CHEST  2 VIEW

[chest pa]
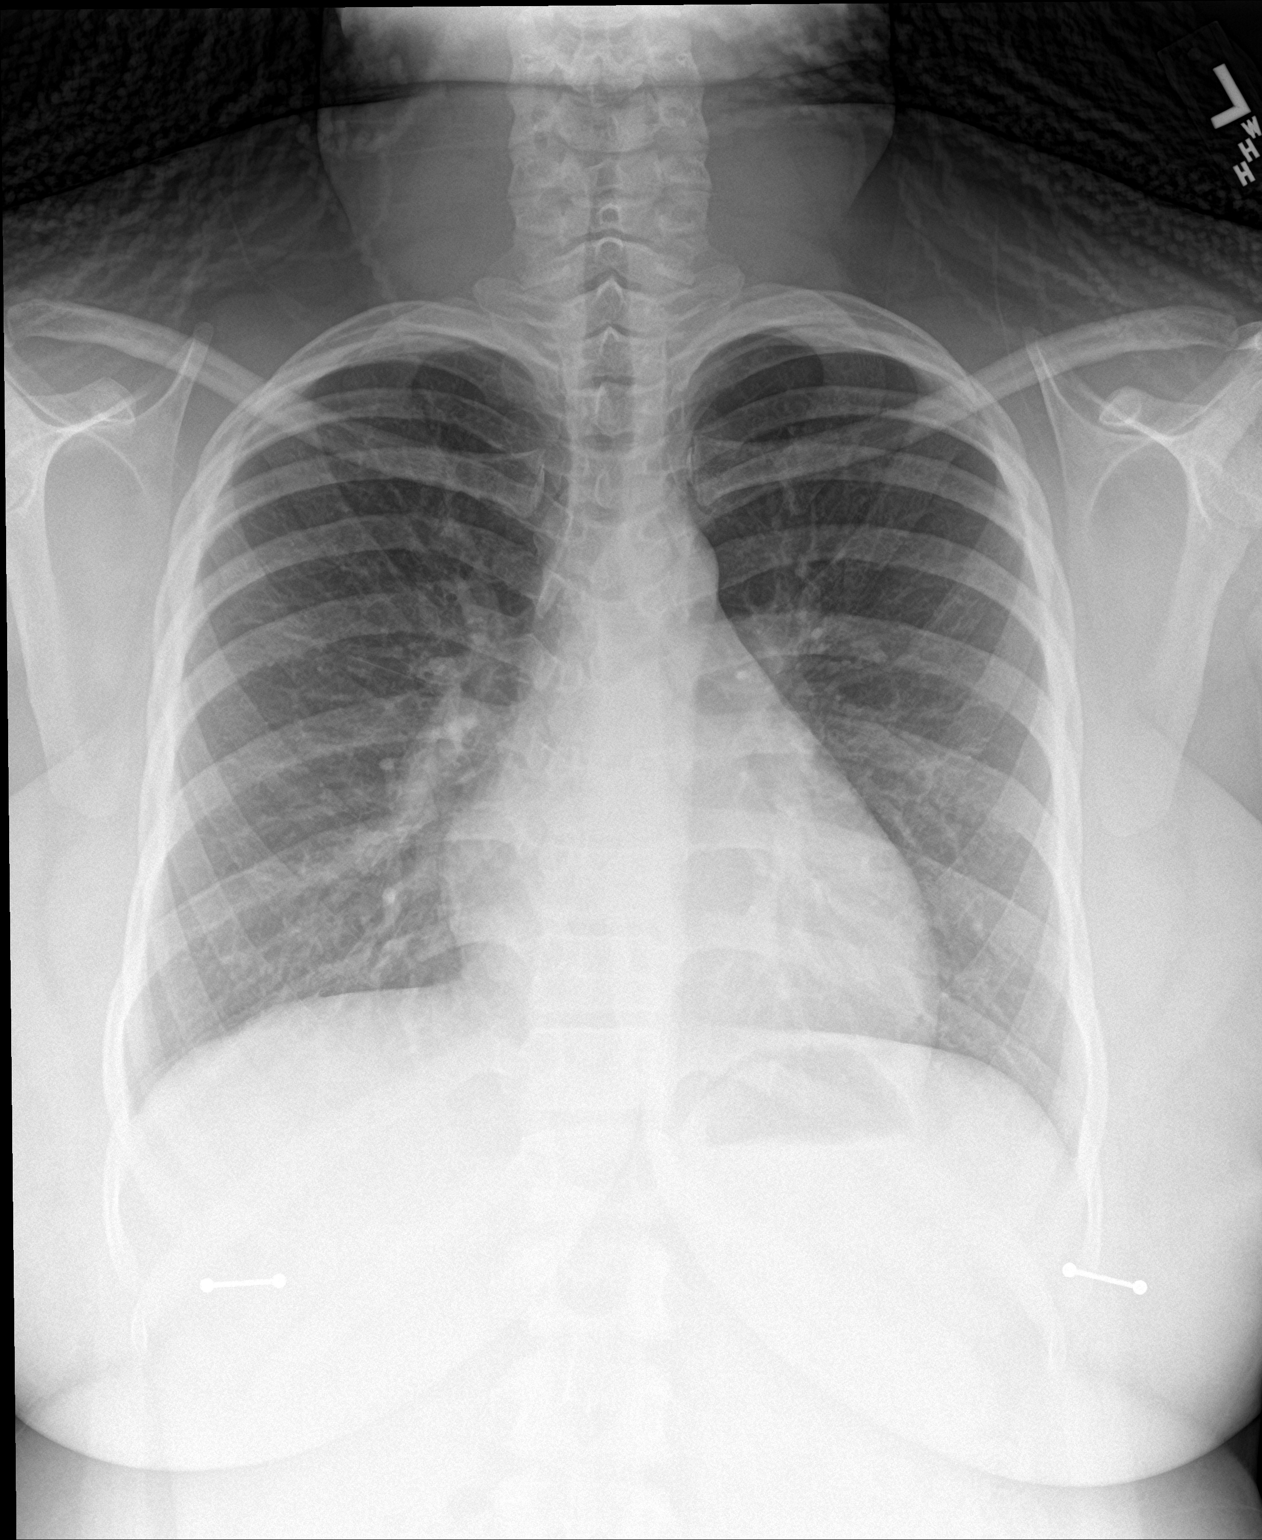

[chest lat]
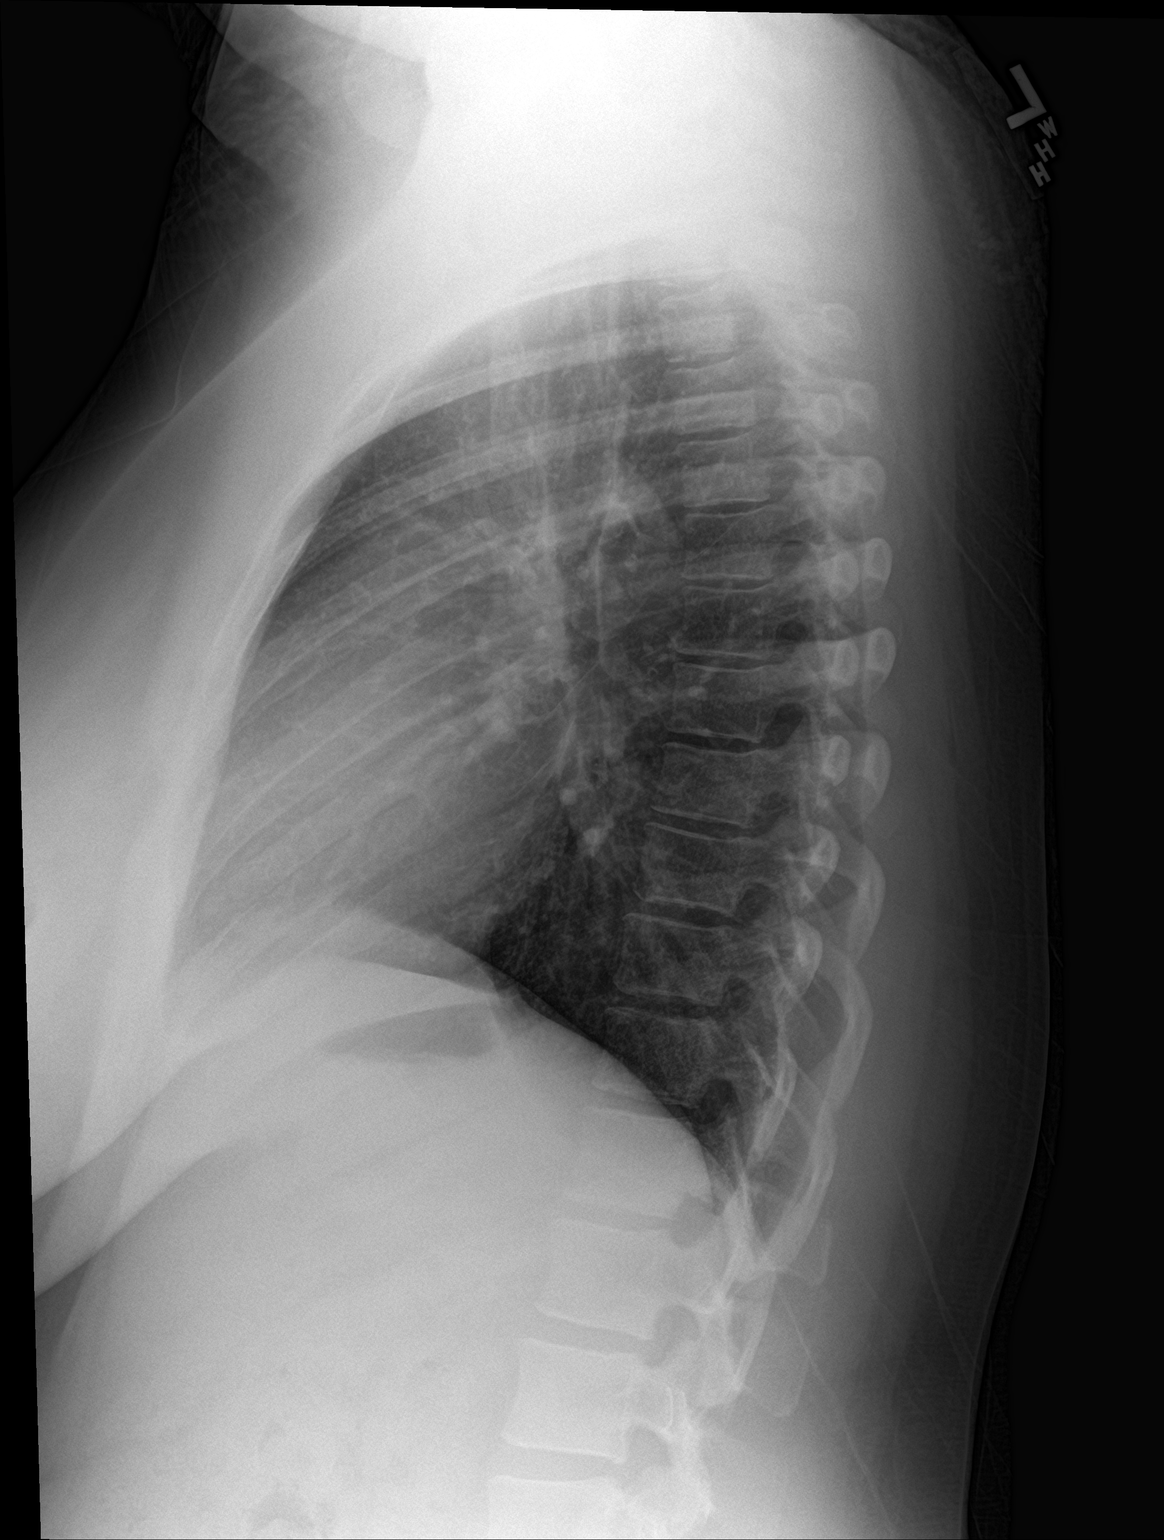

[2 of 2 positions shown; findings below may reference images not displayed]

FINDINGS: Two views of the chest demonstrate minimal interstitial prominence.
There is no focal consolidation, pleural effusion, or pneumothorax.
The cardiac silhouette is within normal limits with the osseous
structures appear unremarkable.
IMPRESSION: No active cardiopulmonary disease.

## 2016-07-16 ENCOUNTER — Emergency Department (HOSPITAL_COMMUNITY)
Admission: EM | Admit: 2016-07-16 | Discharge: 2016-07-16 | Disposition: A | Discharge status: Home / Self Care | Payer: Medicaid Other | Attending: Emergency Medicine | Admitting: Emergency Medicine

## 2016-07-16 ENCOUNTER — Emergency Department (HOSPITAL_COMMUNITY): Payer: Medicaid Other

## 2016-07-16 ENCOUNTER — Encounter (HOSPITAL_COMMUNITY): Payer: Self-pay | Admitting: Emergency Medicine

## 2016-07-16 DIAGNOSIS — J45909 Unspecified asthma, uncomplicated: Secondary | ICD-10-CM | POA: Diagnosis not present

## 2016-07-16 DIAGNOSIS — Z3A17 17 weeks gestation of pregnancy: Secondary | ICD-10-CM | POA: Diagnosis not present

## 2016-07-16 DIAGNOSIS — O26892 Other specified pregnancy related conditions, second trimester: Secondary | ICD-10-CM | POA: Insufficient documentation

## 2016-07-16 DIAGNOSIS — Z86711 Personal history of pulmonary embolism: Secondary | ICD-10-CM | POA: Diagnosis not present

## 2016-07-16 DIAGNOSIS — Z87891 Personal history of nicotine dependence: Secondary | ICD-10-CM | POA: Insufficient documentation

## 2016-07-16 DIAGNOSIS — R252 Cramp and spasm: Secondary | ICD-10-CM

## 2016-07-16 DIAGNOSIS — R0602 Shortness of breath: Secondary | ICD-10-CM | POA: Diagnosis not present

## 2016-07-16 DIAGNOSIS — Z79899 Other long term (current) drug therapy: Secondary | ICD-10-CM | POA: Insufficient documentation

## 2016-07-16 DIAGNOSIS — Z3490 Encounter for supervision of normal pregnancy, unspecified, unspecified trimester: Secondary | ICD-10-CM

## 2016-07-16 NOTE — ED Notes (Signed)
EDP updating patient and family.  

## 2016-07-16 NOTE — Discharge Instructions (Signed)
Doppler study without evidence of any deep vein thrombosis in the left leg. Contact your complicated OB/GYN for follow-up. Continue your medications. Return for any new or worse symptoms.

## 2016-07-16 NOTE — ED Provider Notes (Addendum)
[facility]-EM[facility]Y DEPT Provider Note   CSN: [id number] Arrival date & time: 07/16/16  7948  First Provider Contact:  First MD Initiated Contact with Patient 07/16/16 [id number]        History   Chief Complaint Chief Complaint  Patient presents with  . Leg Pain    HPI Debra Townsend is a 25 y.o. female.  Patient is [redacted] weeks pregnant. Denies any vaginal bleeding or lower abdominal pain. Patient was diagnosed with pulmonary embolus the end of June she is followed by complicated OB in [place]. Patient is on Lovenox. Patient presents today with a complaint of left leg cramps that occurred last evening that still hurt. She is worried about recurrent blood clot. Patient also states that she's had some increased shortness of breath.   The history is provided by the patient.  Leg Pain      Past Medical History:  Diagnosis Date  . Anxiety   . Asthma    childhood  . Chlamydia   . Costochondral chest pain 01/03/2014  . Depression    No treatment, being monitored by Dr. Emelda Fear  . Herpes simplex without mention of complication   . Miscarriage 02/01/2014  . Pregnant 01/03/2014  . Urinary tract infection     Patient Active Problem List   Diagnosis Date Noted  . 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 Suicidal ideations   . Miscarriage 02/01/2014  . Pregnant 01/03/2014  . Costochondral chest pain 01/03/2014  . Herpes simplex type 2 infection 02/20/2013  . Chlamydia 02/20/2013  . Mild or unspecified pre-eclampsia, postpartum condition or complication 11/21/2012    Past Surgical History:  Procedure Laterality Date  . CESAREAN SECTION    . CESAREAN SECTION  11/16/2012   Procedure: CESAREAN SECTION;  Surgeon: Catalina Antigua, MD;  Location: [facility];  Service: Obstetrics;  Laterality: N/A;  . THERAPEUTIC ABORTION      OB History    Gravida Para Term Preterm AB Living   6 2 2  0 1 2   SAB TAB Ectopic Multiple Live Births   0 1 0   1       Home Medications    Prior to Admission medications   Medication Sig Start Date End Date Taking? Authorizing Provider  acetaminophen (TYLENOL) 500 MG tablet Take 1,000 mg by mouth every 6 (six) hours as needed for moderate pain.   Yes Historical Provider, MD  enoxaparin (LOVENOX) 100 MG/ML injection Inject 100 mg into the skin every 12 (twelve) hours.   Yes Historical Provider, MD  polyethylene glycol powder (GLYCOLAX/MIRALAX) powder Take 17 g by mouth daily as needed for constipation.   Yes Historical Provider, MD  prenatal vitamin w/FE, FA (NATACHEW) 29-1 MG CHEW chewable tablet Chew 1 tablet by mouth daily at 12 noon.   Yes Historical Provider, MD    Family History Family History  Problem Relation Age of Onset  . Hypertension Mother   . Heart disease Father   . Hypertension Father   . Heart disease Sister   . Hypertension Maternal Grandfather   . Hypertension Paternal Grandmother   . Diabetes Paternal Grandmother   . Kidney disease Paternal Grandmother   . Liver disease Paternal Grandmother   . Cancer Other     Breast, ovarian  . Diabetes Other   . Hypertension Other   . Stroke Other   . Liver disease Paternal Aunt   . Other Neg Hx     Social History Social History  Substance  Use Topics  . Smoking status: Former Smoker    Types: Cigarettes  . Smokeless tobacco: Never Used  . Alcohol use No     Allergies   Other and Penicillins   Review of Systems Review of Systems  Constitutional: Negative for fever.  HENT: Negative for congestion.   Eyes: Negative for visual disturbance.  Respiratory: Positive for shortness of breath.   Cardiovascular: Positive for leg swelling. Negative for chest pain.  Gastrointestinal: Negative for abdominal pain.  Genitourinary: Negative for dysuria and vaginal bleeding.  Musculoskeletal: Negative for back pain.  Skin: Negative for rash.  Neurological: Negative for headaches.  Hematological: Bruises/bleeds easily.    Psychiatric/Behavioral: Negative for confusion.     Physical Exam Updated Vital Signs BP (!) 104/53   Pulse 76   Temp 98 F (36.7 C) (Oral)   Resp 25   Ht 5' (1.524 m)   Wt 85.7 kg   LMP 03/13/2016   SpO2 99%   BMI 36.91 kg/m   Physical Exam  Constitutional: She is oriented to person, place, and time. She appears well-developed and well-nourished. No distress.  HENT:  Head: Normocephalic and atraumatic.  Eyes: EOM are normal. Pupils are equal, round, and reactive to light.  Neck: Normal range of motion. Neck supple.  Cardiovascular: Normal rate, regular rhythm and normal heart sounds.   Pulmonary/Chest: Effort normal. No respiratory distress.  Abdominal: Soft. There is no tenderness.  Musculoskeletal: Normal range of motion. She exhibits edema.  Slight edema noted to the left ankle area.  Neurological: She is alert and oriented to person, place, and time. No cranial nerve deficit. She exhibits normal muscle tone. Coordination normal.  Skin: Skin is warm.  Nursing note and vitals reviewed.    ED Treatments / Results  Labs (all labs ordered are listed, but only abnormal results are displayed) Labs Reviewed - No data to display  EKG  EKG Interpretation None       Radiology US Venous Img Lower Unilateral Left  Result Date: 07/16/2016 CLINICAL DATA:  Left lower extremity pain. Second trimester gestation. EXAM: LEFT LOWER EXTREMITY VENOUS DUPLEX ULTRASOUND TECHNIQUE: Gray-scale sonography with graded compression, as well as color Doppler and duplex ultrasound were performed to evaluate the left lower extremity deep venous system from the level of the common femoral vein and including the common femoral, femoral, profunda femoral, popliteal and calf veins including the posterior tibial, peroneal and gastrocnemius veins when visible. The superficial great saphenous vein was also interrogated. Spectral Doppler was utilized to evaluate flow at rest and with distal  augmentation maneuvers in the common femoral, femoral and popliteal veins. COMPARISON:  None. FINDINGS: Contralateral Common Femoral Vein: Respiratory phasicity is normal and symmetric with the symptomatic side. No evidence of thrombus. Normal compressibility. Common Femoral Vein: No evidence of thrombus. Normal compressibility, respiratory phasicity and response to augmentation. Saphenofemoral Junction: No evidence of thrombus. Normal compressibility and flow on color Doppler imaging. Profunda Femoral Vein: No evidence of thrombus. Normal compressibility and flow on color Doppler imaging. Femoral Vein: No evidence of thrombus. Normal compressibility, respiratory phasicity and response to augmentation. Popliteal Vein: No evidence of thrombus. Normal compressibility, respiratory phasicity and response to augmentation. Calf Veins: No evidence of thrombus. Normal compressibility and flow on color Doppler imaging. Superficial Great Saphenous Vein: No evidence of thrombus. Normal compressibility and flow on color Doppler imaging. Venous Reflux:  None. Other Findings:  There is left ankle edema. IMPRESSION: No evidence of left lower extremity deep venous thrombosis. Right common femoral  vein also patent. There is left ankle region soft tissue edema. Electronically Signed   By: Bretta Bang III M.D.   On: 07/16/2016 10:27    Procedures Procedures (including critical care time)  Medications Ordered in ED Medications - No data to display   Initial Impression / Assessment and Plan / ED Course  I have reviewed the triage vital signs and the nursing notes.  Pertinent labs & imaging results that were available during my care of the patient were reviewed by me and considered in my medical decision making (see chart for details).  Clinical Course    Patient status post pulmonary embolus diagnosis in June. Is on Lovenox. Patient's being followed by complicated OB/GYN service in [place]. Patient is [redacted]  weeks pregnant. Patient presents today with some increase shortness of breath and left leg cramps. Patient self pulse ox is fairly consistently 100% on room air. Cardiac monitoring without tachycardia. Patient has Doppler studies of her left leg for evaluation of the complaint of cramps in that area.   Doppler studies without evidence of any deep vein thrombosis.  Final Clinical Impressions(s) / ED Diagnoses   Final diagnoses:  Pregnancy  Cramps of left lower extremity  Hx of pulmonary embolus    New Prescriptions New Prescriptions   No medications on file     Vanetta Mulders, MD 07/16/16 1023    Vanetta Mulders, MD 07/16/16 1038

## 2016-07-16 NOTE — ED Triage Notes (Signed)
Pt states she had a cramp in her left leg last night and it still hurts.  Pt also complains of continued left flank pain which she was seen for in June.  States pain is unchanged from previous visit.  Is also [redacted]wks pregnant.  Denies any vaginal bleeding or cramping.

## 2016-09-23 ENCOUNTER — Emergency Department (HOSPITAL_COMMUNITY): Payer: Medicaid Other

## 2016-09-23 ENCOUNTER — Emergency Department (HOSPITAL_COMMUNITY)
Admission: EM | Admit: 2016-09-23 | Discharge: 2016-09-23 | Disposition: A | Discharge status: Home / Self Care | Payer: Medicaid Other | Attending: Emergency Medicine | Admitting: Emergency Medicine

## 2016-09-23 ENCOUNTER — Encounter (HOSPITAL_COMMUNITY): Payer: Self-pay | Admitting: Family Medicine

## 2016-09-23 DIAGNOSIS — R519 Headache, unspecified: Secondary | ICD-10-CM

## 2016-09-23 DIAGNOSIS — I10 Essential (primary) hypertension: Secondary | ICD-10-CM | POA: Insufficient documentation

## 2016-09-23 DIAGNOSIS — H4710 Unspecified papilledema: Secondary | ICD-10-CM | POA: Diagnosis not present

## 2016-09-23 DIAGNOSIS — Z7982 Long term (current) use of aspirin: Secondary | ICD-10-CM | POA: Diagnosis not present

## 2016-09-23 DIAGNOSIS — Z87891 Personal history of nicotine dependence: Secondary | ICD-10-CM | POA: Diagnosis not present

## 2016-09-23 DIAGNOSIS — H538 Other visual disturbances: Secondary | ICD-10-CM

## 2016-09-23 DIAGNOSIS — R51 Headache: Secondary | ICD-10-CM | POA: Diagnosis present

## 2016-09-23 HISTORY — DX: Essential (primary) hypertension: I10

## 2016-09-23 LAB — COMPREHENSIVE METABOLIC PANEL
ALBUMIN: 2.9 g/dL — AB (ref 3.5–5.0)
ALK PHOS: 226 U/L — AB (ref 38–126)
ALT: 16 U/L (ref 14–54)
ANION GAP: 10 (ref 5–15)
AST: 16 U/L (ref 15–41)
BUN: <5 mg/dL — ABNORMAL LOW (ref 6–20)
CALCIUM: 9.5 mg/dL (ref 8.9–10.3)
CHLORIDE: 105 mmol/L (ref 101–111)
CO2: 21 mmol/L — AB (ref 22–32)
Creatinine, Ser: 0.63 mg/dL (ref 0.44–1.00)
GFR calc Af Amer: >60 mL/min (ref >60)
GFR calc non Af Amer: >60 mL/min (ref >60)
GLUCOSE: 72 mg/dL (ref 65–99)
POTASSIUM: 3.7 mmol/L (ref 3.5–5.1)
SODIUM: 136 mmol/L (ref 135–145)
Total Bilirubin: 0.1 mg/dL — ABNORMAL LOW (ref 0.3–1.2)
Total Protein: 6.8 g/dL (ref 6.5–8.1)

## 2016-09-23 LAB — URINE MICROSCOPIC-ADD ON: RBC / HPF: NONE SEEN RBC/hpf (ref 0–5)

## 2016-09-23 LAB — CBC WITH DIFFERENTIAL/PLATELET
BASOS PCT: 0 %
Basophils Absolute: 0 10*3/uL (ref 0.0–0.1)
EOS ABS: 0.1 10*3/uL (ref 0.0–0.7)
EOS PCT: 1 %
HCT: 28.9 % — ABNORMAL LOW (ref 36.0–46.0)
HEMOGLOBIN: 8.9 g/dL — AB (ref 12.0–15.0)
Lymphocytes Relative: 26 %
Lymphs Abs: 2.5 10*3/uL (ref 0.7–4.0)
MCH: 23.9 pg — ABNORMAL LOW (ref 26.0–34.0)
MCHC: 30.8 g/dL (ref 30.0–36.0)
MCV: 77.5 fL — ABNORMAL LOW (ref 78.0–100.0)
MONOS PCT: 9 %
Monocytes Absolute: 0.8 10*3/uL (ref 0.1–1.0)
NEUTROS PCT: 64 %
Neutro Abs: 6.2 10*3/uL (ref 1.7–7.7)
PLATELETS: 210 10*3/uL (ref 150–400)
RBC: 3.73 MIL/uL — ABNORMAL LOW (ref 3.87–5.11)
RDW: 14.7 % (ref 11.5–15.5)
WBC: 9.6 10*3/uL (ref 4.0–10.5)

## 2016-09-23 LAB — URINALYSIS, ROUTINE W REFLEX MICROSCOPIC
Bilirubin Urine: NEGATIVE
GLUCOSE, UA: NEGATIVE mg/dL
Hgb urine dipstick: NEGATIVE
Ketones, ur: >80 mg/dL — AB
NITRITE: NEGATIVE
PH: 7 (ref 5.0–8.0)
Protein, ur: 30 mg/dL — AB
SPECIFIC GRAVITY, URINE: 1.016 (ref 1.005–1.030)

## 2016-09-23 NOTE — ED Notes (Signed)
Patient transported to MRI 

## 2016-09-23 NOTE — Progress Notes (Signed)
1405 Arrived to evaluate this 25 yo G6P2 in with report of being sent from eye doctor for concerns of edema in eyes.  Patient has a hx of transient vision loss and headaches. [place] [facility] and [facility]. Her pregnancy history includes C/S x 2 and postpartum pre eclampsia with admission and treatment with Magnesium Sulfate. Denies high blood pressure or protein in urine with this pregnancy.  She does have a history in this pregnancy of PE and is on lovenox.   She also states that her OB is monitoring a "cyst in the amniotic sac".  1454 To MRI.  FHR Category I, 1 UC per 40 min, pt did not feel.  1510  Dr. Parveen Freehling FullingHarraway-Smith notified of pt in ED and of above.  She states if the patient's urine is negative for protein, then she can be OB cleared.  If she requires medical admission ED staff to notify FP/OBRR RN for EFM orders.

## 2016-09-23 NOTE — ED Triage Notes (Signed)
[facility] with c/o transient vision loss with accompanying headache x1-2 years, with worsening over the last 55mo.  Saw Eye doc today and he is concerned for OU disc edema and Cerebral Venous Sinus Thrombosis. Patient is [redacted] weeks pregnant and see [facility]. No other complaints, A&O x4 in NAD.

## 2016-09-23 NOTE — ED Provider Notes (Signed)
[facility]-EMER[facility]ENCY DEPT Provider Note   CSN: [id number] Arrival date & time: 09/23/16  1208     History   Chief Complaint Chief Complaint  Patient presents with  . Visual Field Change  . Headache    HPI Debra Townsend is a 25 y.o. female.   Headache   This is a new problem. Episode onset: 1 yr. Episode frequency: intermittent. The problem has been gradually worsening. Associated with: blurry vision. The pain is located in the temporal and right unilateral region. The quality of the pain is described as sharp. The pain is moderate. The pain does not radiate. Associated symptoms include nausea. Pertinent negatives include no anorexia, no fever, no malaise/fatigue, no chest pressure, no near-syncope, no orthopnea, no palpitations, no syncope, no shortness of breath and no vomiting. She has tried acetaminophen for the symptoms. The treatment provided significant relief.   Pt currently asymptomatic.  Pain always occurs after blurry vision, which last approx 15-20 min. Headache starts while vision is coming back.   Pt is [redacted] wk pregnant. G6p2.  Currently on lovenox for PE.  Seen bu Optho in clinic and noted to have bilateral papilledema. Sent for VST w/u.   Past Medical History:  Diagnosis Date  . Anxiety   . Chlamydia   . Costochondral chest pain 01/03/2014  . Depression    No treatment, being monitored by Dr. Emelda Fear  . Herpes simplex without mention of complication   . Hypertension   . Miscarriage 02/01/2014  . Pregnant 01/03/2014  . Urinary tract infection     Patient Active Problem List   Diagnosis Date Noted  . 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 Suicidal ideations   . Miscarriage 02/01/2014  . Pregnant 01/03/2014  . Costochondral chest pain 01/03/2014  . Herpes simplex type 2 infection 02/20/2013  . Chlamydia 02/20/2013  . Mild or unspecified pre-eclampsia, postpartum condition or complication 11/21/2012    Past  Surgical History:  Procedure Laterality Date  . CESAREAN SECTION    . CESAREAN SECTION  11/16/2012   Procedure: CESAREAN SECTION;  Surgeon: Catalina Antigua, MD;  Location: [facility];  Service: Obstetrics;  Laterality: N/A;  . THERAPEUTIC ABORTION      OB History    Gravida Para Term Preterm AB Living   6 2 2  0 2 2   SAB TAB Ectopic Multiple Live Births   0 1 0   2       Home Medications    Prior to Admission medications   Medication Sig Start Date End Date Taking? Authorizing Provider  acetaminophen (TYLENOL) 500 MG tablet Take 1,000 mg by mouth every 6 (six) hours as needed for moderate pain.   Yes Historical Provider, MD  aspirin EC 81 MG tablet Take 81 mg by mouth daily.   Yes Historical Provider, MD  enoxaparin (LOVENOX) 100 MG/ML injection Inject 100 mg into the skin every 12 (twelve) hours.   Yes Historical Provider, MD  polyethylene glycol powder (GLYCOLAX/MIRALAX) powder Take 17 g by mouth daily as needed for constipation.   Yes Historical Provider, MD  prenatal vitamin w/FE, FA (NATACHEW) 29-1 MG CHEW chewable tablet Chew 1 tablet by mouth daily at 12 noon.   Yes Historical Provider, MD    Family History Family History  Problem Relation Age of Onset  . Hypertension Mother   . Heart disease Father   . Hypertension Father   . Heart disease Sister   . Hypertension Maternal Grandfather   .  Hypertension Paternal Grandmother   . Diabetes Paternal Grandmother   . Kidney disease Paternal Grandmother   . Liver disease Paternal Grandmother   . Cancer Other     Breast, ovarian  . Diabetes Other   . Hypertension Other   . Stroke Other   . Liver disease Paternal Aunt   . Other Neg Hx     Social History Social History  Substance Use Topics  . Smoking status: Former Smoker    Types: Cigarettes  . Smokeless tobacco: Never Used  . Alcohol use No     Allergies   Other and Penicillins   Review of Systems Review of Systems  Constitutional: Negative for chills, fever  and malaise/fatigue.  HENT: Negative for ear pain and sore throat.   Eyes: Positive for visual disturbance. Negative for pain.  Respiratory: Negative for cough and shortness of breath.   Cardiovascular: Negative for chest pain, palpitations, orthopnea, syncope and near-syncope.  Gastrointestinal: Positive for nausea. Negative for abdominal pain, anorexia and vomiting.  Genitourinary: Negative for dysuria and hematuria.  Musculoskeletal: Negative for arthralgias and back pain.  Skin: Negative for color change and rash.  Neurological: Positive for headaches. Negative for seizures, syncope, speech difficulty, weakness, light-headedness and numbness.  All other systems reviewed and are negative.    Physical Exam Updated Vital Signs BP 107/68 (BP Location: Left Arm)   Pulse 100   Temp 98.8 F (37.1 C) (Oral)   Resp 18   LMP 03/13/2016   SpO2 100%   Physical Exam  Constitutional: She is oriented to person, place, and time. She appears well-developed and well-nourished. No distress.  HENT:  Head: Normocephalic and atraumatic.  Nose: Nose normal.  Eyes: Conjunctivae and EOM are normal. Right eye exhibits no discharge. Left eye exhibits no discharge. No scleral icterus.  Fundoscopic exam:      The right eye shows papilledema ( medially; with loss of venous pulse).       The left eye shows papilledema (medially; with loss of venous pulse).  Neck: Normal range of motion. Neck supple.  Cardiovascular: Normal rate and regular rhythm.  Exam reveals no gallop and no friction rub.   No murmur heard. Pulmonary/Chest: Effort normal and breath sounds normal. No stridor. No respiratory distress. She has no rales.  Abdominal: Soft. She exhibits no distension. There is no tenderness.  Musculoskeletal: She exhibits no edema or tenderness.  Neurological: She is alert and oriented to person, place, and time.  Mental Status: Alert and oriented to person, place, and time. Attention and concentration  normal. Speech clear. Recent memory is intac  Cranial Nerves  II Visual Fields: Intact to confrontation. Visual fields intact. III, IV, VI: Pupils equal and reactive to light and near. Full eye movement without nystagmus  V Facial Sensation: Normal. No weakness of masticatory muscles  VII: No facial weakness or asymmetry  VIII Auditory Acuity: Grossly normal  IX/X: The uvula is midline; the palate elevates symmetrically  XI: Normal sternocleidomastoid and trapezius strength  XII: The tongue is midline. No atrophy or fasciculations.   Motor System: Muscle Strength: 5/5 and symmetric in the upper and lower extremities. No pronation or drift.  Muscle Tone: Tone and muscle bulk are normal in the upper and lower extremities.   Reflexes: DTRs: 2+ and symmetrical in all four extremities. Plantar responses are flexor bilaterally.  Coordination: Intact finger-to-nose. No tremor.  Sensation: Intact to light touch. Gait: deferred   Skin: Skin is warm and dry. No rash noted. She  is not diaphoretic. No erythema.  Psychiatric: She has a normal mood and affect.  Vitals reviewed.    ED Treatments / Results  Labs (all labs ordered are listed, but only abnormal results are displayed) Labs Reviewed  CBC WITH DIFFERENTIAL/PLATELET - Abnormal; Notable for the following:       Result Value   RBC 3.73 (*)    Hemoglobin 8.9 (*)    HCT 28.9 (*)    MCV 77.5 (*)    MCH 23.9 (*)    All other components within normal limits  COMPREHENSIVE METABOLIC PANEL - Abnormal; Notable for the following:    CO2 21 (*)    BUN <5 (*)    Albumin 2.9 (*)    Alkaline Phosphatase 226 (*)    Total Bilirubin 0.1 (*)    All other components within normal limits  URINALYSIS, ROUTINE W REFLEX MICROSCOPIC (NOT AT [facility])    EKG  EKG Interpretation None       Radiology Mr Mrv Head Wo Cm  Result Date: 09/23/2016 CLINICAL DATA:  25 year old female with transient vision loss, and history of headaches for 1-2 years.  Papilledema on eye exam today. The patient is [redacted] weeks pregnant. Initial encounter. EXAM: MR MRV HEAD WITHOUT CONTRAST TECHNIQUE: Noncontrast time-of-flight MR venogram performed with maximum intensity projection image reformatting. COMPARISON:  Head CT without contrast 06/21/2016. FINDINGS: Preserved flow signal in the superior sagittal sinus, the torcula, straight sinus, vein of Galen, internal cerebral veins, and basal veins of Rosenthal. There is preserved flow signal in the small inferior sagittal sinus. Flow signal is preserved in both transverse and sigmoid sinuses, although the right appears dominant and there is some narrowing of the bilateral transverse -sigmoid sinus junctions. There is preserved flow signal in both IJ bulbs. No definite partially empty sella. IMPRESSION: Negative for evidence of venous sinus thrombosis although the appearance and configuration of the transverse and sigmoid sinuses is such as can be seen with idiopathic intracranial hypertension (pseudotumor cerebri). Electronically Signed   By: Odessa Fleming M.D.   On: 09/23/2016 15:50    Procedures Procedures (including critical care time)  Medications Ordered in ED Medications - No data to display   Initial Impression / Assessment and Plan / ED Course  I have reviewed the triage vital signs and the nursing notes.  Pertinent labs & imaging results that were available during my care of the patient were reviewed by me and considered in my medical decision making (see chart for details).  Clinical Course    1. Headache with blurry vision Currently asymptomatic. Presents to rule out venous sinus thrombosis. Exam nonfocal however patient does have bilateral papilledema. Also concerned for IIH, but will r/o VST. MRV negative, but findings are consistent with possible IIH. As pt is pregnant and asymptomatic, I do not feel that LP would be beneficial at this time. Will refer to Neurology.  2. Pregnancy OB rapid response and  evaluated the patient in the ED and noted great fetal heart tones. Patient does have a prior history of postpartum preeclampsia however patient is not hypertensive. LFTs within normal limits. Urine contaminated with mild proteinuria. Cleared by OB.  The patient is safe for discharge with strict return precautions.   Final Clinical Impressions(s) / ED Diagnoses   Final diagnoses:  Papilledema   Disposition: Discharge  Condition: Good  I have discussed the results, Dx and Tx plan with the patient who expressed understanding and agree(s) with the plan. Discharge instructions discussed at great length.  The patient was given strict return precautions who verbalized understanding of the instructions. No further questions at time of discharge.    Current Discharge Medication List      Follow Up: Oley BalmDavid B. Margo Commonapper, MD [address] [place] [id number] [phone]  Schedule an appointment as soon as possible for a visit  As needed  [facility] [address], [address] [place] [id number] [phone] Schedule an appointment as soon as possible for a visit  For close follow up to assess for idiopathic intracranial hypertension      Nira ConnPedro Eduardo Cardama, MD 09/23/16 1750

## 2016-09-27 ENCOUNTER — Encounter: Payer: Self-pay | Admitting: Neurology

## 2016-09-27 ENCOUNTER — Ambulatory Visit (INDEPENDENT_AMBULATORY_CARE_PROVIDER_SITE_OTHER): Payer: Medicaid Other | Admitting: Neurology

## 2016-09-27 VITALS — BP 122/72 | HR 110 | Temp 98.2°F | Ht 60.0 in | Wt 197.4 lb

## 2016-09-27 DIAGNOSIS — Z3A28 28 weeks gestation of pregnancy: Secondary | ICD-10-CM

## 2016-09-27 DIAGNOSIS — H4711 Papilledema associated with increased intracranial pressure: Secondary | ICD-10-CM | POA: Diagnosis not present

## 2016-09-27 MED ORDER — ACETAZOLAMIDE 250 MG PO TABS: ORAL_TABLET | ORAL | 6 refills | Status: DC

## 2016-09-27 NOTE — Patient Instructions (Addendum)
1. Schedule MRI brain without contrast 2. Start Acetazolamide 250mg : Take 1 tablet twice a day for 1 week, then increase to 2 tablets twice a day 3. Follow-up in 2 months (Dec 18), call for any changes 4. Wishing you well with the rest of your pregnancy!

## 2016-09-27 NOTE — Progress Notes (Signed)
NEUROLOGY CONSULTATION NOTE  Debra Townsend MRN: [id number] DOB: 11-06-1991  Referring provider: Dr. Drema Pry (ER) Primary care provider: Dr. Wyvonnia Lora  Reason for consult:  Papilledema  Dear Dr Eudelia Bunch:  Thank you for your kind referral of Debra Townsend for consultation of the above symptoms. Although her history is well known to you, please allow me to reiterate it for the purpose of our medical record. Records and images were personally reviewed where available.  HISTORY OF PRESENT ILLNESS: This is a pleasant 25 year old left-handed 28-week pregnant woman presenting for papilledema and headaches. She started noticing her vision going very blurred intermittently for around 15 minutes once in a blue moon around 2 years ago. Two weeks ago, these episodes started occurring on a daily basis, her vision would get blurred for 15-20 minutes, then she would get a "massive headache" with diffuse throbbing and stabbing pain starting on the right temporal region radiating throughout her head. She takes Tylenol and has to lie down, with headaches lasting all day. Tylenol does not help. She saw her eye doctor last 09/23/16 and was found to have bilateral papilledema. Due to history of PE last June (on Lovenox), she was sent to the ER for an MRI brain and MRV. She had an MRV which did not show any evidence of cerebral venous thrombosis. It was noted that the appearance and configuration of the transverse and sigmoid sinuses are such that can be seen with idiopathic intracranial hypertension. I do not see an MRI brain done. She was discharged with Neurology follow-up. She reports only one episode of nausea last Thursday. She is concerned that her legs are weak, with pain in both groin regions. Sometimes if she sits for a prolonged period, she cannot move her legs to get up. No numbness/tingling. Arms are not involved. She has noticed occasional numbness in her face. She denies any diplopia,  dysarthria/dysphagia, bowel/bladder dysfunction. She has neck and back pain. No prior history of headaches. This is her third pregnancy, she is scheduled for induction on 12/11/2016.   Laboratory Data: Lab Results  Component Value Date   WBC 9.6 09/23/2016   HGB 8.9 (L) 09/23/2016   HCT 28.9 (L) 09/23/2016   MCV 77.5 (L) 09/23/2016   PLT 210 09/23/2016     Chemistry      Component Value Date/Time   NA 136 09/23/2016 1318   K 3.7 09/23/2016 1318   CL 105 09/23/2016 1318   CO2 21 (L) 09/23/2016 1318   BUN <5 (L) 09/23/2016 1318   CREATININE 0.63 09/23/2016 1318      Component Value Date/Time   CALCIUM 9.5 09/23/2016 1318   ALKPHOS 226 (H) 09/23/2016 1318   AST 16 09/23/2016 1318   ALT 16 09/23/2016 1318   BILITOT 0.1 (L) 09/23/2016 1318       PAST MEDICAL HISTORY: Past Medical History:  Diagnosis Date  . Anxiety   . Chlamydia   . Costochondral chest pain 01/03/2014  . Depression    No treatment, being monitored by Dr. Emelda Fear  . Herpes simplex without mention of complication   . Hypertension   . Miscarriage 02/01/2014  . Pregnant 01/03/2014  . Urinary tract infection     PAST SURGICAL HISTORY: Past Surgical History:  Procedure Laterality Date  . CESAREAN SECTION    . CESAREAN SECTION  11/16/2012   Procedure: CESAREAN SECTION;  Surgeon: Catalina Antigua, MD;  Location: [facility];  Service: Obstetrics;  Laterality: N/A;  . THERAPEUTIC ABORTION  MEDICATIONS: Current Outpatient Prescriptions on File Prior to Visit  Medication Sig Dispense Refill  . acetaminophen (TYLENOL) 500 MG tablet Take 1,000 mg by mouth every 6 (six) hours as needed for moderate pain.    Marland Kitchen aspirin EC 81 MG tablet Take 81 mg by mouth daily.    Marland Kitchen enoxaparin (LOVENOX) 100 MG/ML injection Inject 100 mg into the skin every 12 (twelve) hours.    . polyethylene glycol powder (GLYCOLAX/MIRALAX) powder Take 17 g by mouth daily as needed for constipation.    . prenatal vitamin w/FE, FA (NATACHEW) 29-1  MG CHEW chewable tablet Chew 1 tablet by mouth daily at 12 noon.     No current facility-administered medications on file prior to visit.     ALLERGIES: Allergies  Allergen Reactions  . Other Hives    Peaches   . Penicillins Hives        FAMILY HISTORY: Family History  Problem Relation Age of Onset  . Hypertension Mother   . Heart disease Father   . Hypertension Father   . Heart disease Sister   . Hypertension Maternal Grandfather   . Hypertension Paternal Grandmother   . Diabetes Paternal Grandmother   . Kidney disease Paternal Grandmother   . Liver disease Paternal Grandmother   . Cancer Other     Breast, ovarian  . Diabetes Other   . Hypertension Other   . Stroke Other   . Liver disease Paternal Aunt   . Other Neg Hx     SOCIAL HISTORY: Social History   Social History  . Marital status: Married    Spouse name: N/A  . Number of children: N/A  . Years of education: N/A   Occupational History  . Not on file.   Social History Main Topics  . Smoking status: Former Smoker    Types: Cigarettes  . Smokeless tobacco: Never Used  . Alcohol use No  . Drug use: No     Comment: Last used 2 months ago  . Sexual activity: Yes    Birth control/ protection: None   Other Topics Concern  . Not on file   Social History Narrative  . No narrative on file    REVIEW OF SYSTEMS: Constitutional: No fevers, chills, or sweats, no generalized fatigue, change in appetite Eyes: No visual changes, double vision, eye pain Ear, nose and throat: No hearing loss, ear pain, nasal congestion, sore throat Cardiovascular: No chest pain, palpitations Respiratory:  No shortness of breath at rest or with exertion, wheezes GastrointestinaI: No nausea, vomiting, diarrhea, abdominal pain, fecal incontinence Genitourinary:  No dysuria, urinary retention or frequency Musculoskeletal:  + neck pain, back pain Integumentary: No rash, pruritus, skin lesions Neurological: as  above Psychiatric: No depression, insomnia, anxiety Endocrine: No palpitations, fatigue, diaphoresis, mood swings, change in appetite, change in weight, increased thirst Hematologic/Lymphatic:  No anemia, purpura, petechiae. Allergic/Immunologic: no itchy/runny eyes, nasal congestion, recent allergic reactions, rashes  PHYSICAL EXAM: Vitals:   09/27/16 0846  BP: 122/72  Pulse: (!) 110  Temp: 98.2 F (36.8 C)   General: No acute distress Head:  Normocephalic/atraumatic Eyes: Fundoscopic exam shows bilateral papilledema, venous pulsations seen Neck: supple, no paraspinal tenderness, full range of motion Back: No paraspinal tenderness Heart: regular rate and rhythm Lungs: Clear to auscultation bilaterally. Vascular: No carotid bruits. Skin/Extremities: No rash, no edema Neurological Exam: Mental status: alert and oriented to person, place, and time, no dysarthria or aphasia, Fund of knowledge is appropriate.  Recent and remote memory are intact.  Attention and concentration are normal.    Able to name objects and repeat phrases. Cranial nerves: CN I: not tested CN II: pupils equal, round and reactive to light, visual fields intact, fundoscopic exam shows bilateral papilledema, venous pulsations seenCN III, IV, VI:  full range of motion, no nystagmus, no ptosis CN V: facial sensation intact CN VII: upper and lower face symmetric CN VIII: hearing intact to finger rub CN IX, X: gag intact, uvula midline CN XI: sternocleidomastoid and trapezius muscles intact CN XII: tongue midline Bulk & Tone: normal, no fasciculations. Motor: 5/5 throughout with no pronator drift. Sensation: intact to light touch, cold, pin, vibration and joint position sense.  No extinction to double simultaneous stimulation.  Romberg test negative Deep Tendon Reflexes: +2 throughout, no ankle clonus, +left Hoffman sign Plantar responses: downgoing bilaterally Cerebellar: no incoordination on finger to nose, heel  to shin. No dysdiadochokinesia Gait: narrow-based and steady, able to tandem walk adequately. Tremor: none  IMPRESSION: This is a pleasant 25 year old left-handed woman who is [redacted] weeks pregnant, recently diagnosed pulmonary emboli last June on Lovenox, presenting with bilateral papilledema and headaches. We discussed increased intracranial pressure that can be seen with pregnancy, however she reports rare blurred vision episodes started 2 years before her current pregnancy. She had an MRV which did not show venous thrombosis, however needs an MRI brain without contrast as well to rule out underlying structural abnormality. She will start acetazolamide 250mg  BID x 1 week, then increase to 500mg  BID, side effects were discussed. We also discussed that this medication is class C, at this point benefits outweigh risks on fetus, and she expressed understanding. She will let her OB know as well. She will follow-up in 2 months (prior to induction), and knows to call for any changes.   Thank you for allowing me to participate in the care of this patient. Please do not hesitate to call for any questions or concerns.   Patrcia DollyKaren Aquino, M.D.  CC: Dr. Margo Commonapper, [facility] [place]

## 2016-10-19 ENCOUNTER — Ambulatory Visit: Payer: Self-pay | Admitting: Neurology

## 2016-11-29 ENCOUNTER — Ambulatory Visit: Payer: Self-pay | Admitting: Neurology

## 2016-12-02 ENCOUNTER — Encounter: Payer: Self-pay | Admitting: Neurology

## 2016-12-23 ENCOUNTER — Inpatient Hospital Stay (HOSPITAL_COMMUNITY)
Admission: AD | Admit: 2016-12-23 | Discharge: 2016-12-23 | Disposition: A | Discharge status: Home / Self Care | Payer: Medicaid Other | Source: Ambulatory Visit | Attending: Obstetrics and Gynecology | Admitting: Obstetrics and Gynecology

## 2016-12-23 ENCOUNTER — Encounter (HOSPITAL_COMMUNITY): Payer: Self-pay | Admitting: *Deleted

## 2016-12-23 DIAGNOSIS — F419 Anxiety disorder, unspecified: Secondary | ICD-10-CM | POA: Diagnosis not present

## 2016-12-23 DIAGNOSIS — F329 Major depressive disorder, single episode, unspecified: Secondary | ICD-10-CM | POA: Insufficient documentation

## 2016-12-23 DIAGNOSIS — Z86711 Personal history of pulmonary embolism: Secondary | ICD-10-CM | POA: Insufficient documentation

## 2016-12-23 DIAGNOSIS — O99345 Other mental disorders complicating the puerperium: Secondary | ICD-10-CM | POA: Insufficient documentation

## 2016-12-23 DIAGNOSIS — R109 Unspecified abdominal pain: Secondary | ICD-10-CM | POA: Diagnosis present

## 2016-12-23 DIAGNOSIS — Z87891 Personal history of nicotine dependence: Secondary | ICD-10-CM | POA: Insufficient documentation

## 2016-12-23 DIAGNOSIS — O909 Complication of the puerperium, unspecified: Secondary | ICD-10-CM | POA: Insufficient documentation

## 2016-12-23 DIAGNOSIS — Z7982 Long term (current) use of aspirin: Secondary | ICD-10-CM | POA: Diagnosis not present

## 2016-12-23 DIAGNOSIS — O1093 Unspecified pre-existing hypertension complicating the puerperium: Secondary | ICD-10-CM | POA: Insufficient documentation

## 2016-12-23 DIAGNOSIS — R1032 Left lower quadrant pain: Secondary | ICD-10-CM | POA: Diagnosis not present

## 2016-12-23 HISTORY — DX: Other pulmonary embolism without acute cor pulmonale: I26.99

## 2016-12-23 MED ORDER — IBUPROFEN 800 MG PO TABS
800.0000 mg | ORAL_TABLET | Freq: Once | ORAL | Status: AC
  Administered 2016-12-23: 800 mg via ORAL
  Filled 2016-12-23: qty 1

## 2016-12-23 NOTE — Discharge Instructions (Signed)
Postpartum Care After Cesarean Delivery  The period of time right after you deliver your newborn is called the postpartum period.  What kind of medical care will I receive?  · You may continue to receive fluids and medicines through an IV tube inserted into one of your veins.  · You may have small, flexible tube (catheter) draining urine from your bladder into a bag outside of your body. The catheter will be removed as soon as possible.  · You may be given a squirt bottle to use when you go to the bathroom. You may use this until you are comfortable wiping as usual. To use the squirt bottle, follow these steps:  ? Before you urinate, fill the squirt bottle with warm water. The water should be warm. Do not use hot water.  ? After you urinate, while you are sitting on the toilet, use the squirt bottle to rinse the area around your urethra and vaginal opening. This rinses away any urine and blood.  ? You may do this instead of wiping. As you start healing, you may use the squirt bottle before wiping yourself. Make sure to wipe gently.  ? Fill the squirt bottle with clean water every time you use the bathroom.  · You will be given sanitary pads to wear.  · Your incision will be monitored to make sure it is healing properly. You will be told when it is safe for your stitches, staples, or skin adhesive tape to be removed.  What can I expect?  · You may not feel the need to urinate for several hours after delivery.  · You will have some soreness and pain in your abdomen. You may have a small amount of blood or clear fluid coming from your incision.  · If you are breastfeeding, you may have uterine contractions every time you breastfeed for up to several weeks postpartum. Uterine contractions help your uterus return to its normal size.  · It is normal to have vaginal bleeding (lochia) after delivery. The amount and appearance of lochia is often similar to a menstrual period in the first week after delivery. It will  gradually decrease over the next few weeks to a dry, yellow-brown discharge. For most women, lochia stops completely by 6-8 weeks after delivery. Vaginal bleeding can vary from woman to woman.  · Within the first few days after delivery, you may have breast engorgement. This is when your breasts feel heavy, full, and uncomfortable. Your breasts may also throb and feel hard, tightly stretched, warm, and tender. After this occurs, you may have milk leaking from your breasts. Your health care provider can help you relieve discomfort due to breast engorgement. Breast engorgement should go away within a few days.  · You may feel more sad or worried than normal due to hormonal changes after delivery. These feelings should not last more than a few days. If these feelings do not go away after several days, speak with your health care provider.  How should I care for myself?  · Tell your health care provider if you have pain or discomfort.  · Drink enough water to keep your urine clear or pale yellow.  · Wash your hands thoroughly with soap and water for at least 20 seconds after changing your sanitary pads or using the toilet, and before holding or feeding your baby.  · If you are not breastfeeding, avoid touching your breasts a lot. Doing this can make your breasts produce more milk.  · If   you become weak or lightheaded, or you feel like you might faint, ask for help before:  ? Getting out of bed.  ? Showering.  · Change your sanitary pads frequently. Watch for any changes in your flow, such as a sudden increase in volume, a change in color, or the passing of large blood clots. If you pass a blood clot from your vagina, save it to show to your health care provider. Do not flush blood clots down the toilet without having your health care provider look at them.  · Make sure that all your vaccinations are up to date. This can help protect you and your baby from getting certain diseases. You may need to have immunizations done  before you leave the hospital.  · If desired, talk with your health care provider about methods of family planning or birth control (contraception).  How can I start bonding with my baby?  Spending as much time as possible with your baby is very important. During this time, you and your baby can get to know each other and develop a bond. Having your baby stay with you in your room (rooming in) can give you time to get to know your baby. Rooming in can also help you become comfortable caring for your baby. Breastfeeding can also help you bond with your baby.  How can I plan for returning home with my baby?  · Make sure that you have a car seat installed in your vehicle.  ? Your car seat should be checked by a certified car seat installer to make sure that it is installed safely.  ? Make sure that your baby fits into the car seat safely.  · Ask your health care provider any questions you have about caring for yourself or your baby. Make sure that you are able to contact your health care provider with any questions after leaving the hospital.  This information is not intended to replace advice given to you by your health care provider. Make sure you discuss any questions you have with your health care provider.  Document Released: 08/23/2012 Document Revised: 05/03/2016 Document Reviewed: 11/03/2015  Elsevier Interactive Patient Education © 2017 [employer] Inc[employer]

## 2016-12-23 NOTE — MAU Provider Note (Signed)
History     CSN: [id number]  Arrival date and time: 12/23/16 2207   First Provider Initiated Contact with Patient 12/23/16 2248      Chief Complaint  Patient presents with  . Abdominal Pain   Abdominal Pain  This is a new problem. The current episode started yesterday. The onset quality is sudden. The problem occurs intermittently. The problem has been unchanged. The pain is located in the LLQ. The pain is at a severity of 0/10 (Only hurts when I have a bowl movement. ). The patient is experiencing no pain. The quality of the pain is sharp. The abdominal pain does not radiate. Pertinent negatives include no constipation (last BM today, normal but had pain with BM. ), diarrhea, dysuria, fever, nausea or vomiting. The pain is aggravated by bowel movement. The pain is relieved by nothing. She has tried nothing for the symptoms. Her past medical history is significant for abdominal surgery.    Past Medical History:  Diagnosis Date  . Anxiety   . Chlamydia   . Costochondral chest pain 01/03/2014  . Depression    No treatment, being monitored by Dr. Emelda Fear  . Herpes simplex without mention of complication   . Hypertension   . Miscarriage 02/01/2014  . Pregnant 01/03/2014  . Pulmonary embolism (HCC)   . Urinary tract infection     Past Surgical History:  Procedure Laterality Date  . CESAREAN SECTION    . CESAREAN SECTION  11/16/2012   Procedure: CESAREAN SECTION;  Surgeon: Catalina Antigua, MD;  Location: [facility];  Service: Obstetrics;  Laterality: N/A;  . THERAPEUTIC ABORTION      Family History  Problem Relation Age of Onset  . Hypertension Mother   . Heart disease Father   . Hypertension Father   . Heart disease Sister   . Hypertension Maternal Grandfather   . Hypertension Paternal Grandmother   . Diabetes Paternal Grandmother   . Kidney disease Paternal Grandmother   . Liver disease Paternal Grandmother   . Cancer Other     Breast, ovarian  . Diabetes Other   .  Hypertension Other   . Stroke Other   . Liver disease Paternal Aunt   . Other Neg Hx     Social History  Substance Use Topics  . Smoking status: Former Smoker    Types: Cigarettes  . Smokeless tobacco: Never Used  . Alcohol use No    Allergies:  Allergies  Allergen Reactions  . Other Hives    Peaches   . Penicillins Hives    Has patient had a PCN reaction causing immediate rash, facial/tongue/throat swelling, SOB or lightheadedness with hypotension: Yesyes Has patient had a PCN reaction causing severe rash involving mucus membranes or skin necrosis: no Has patient had a PCN reaction that required hospitalization No Has patient had a PCN reaction occurring within the last 10 years: yes If all of the above answers are "NO", then may proceed with Cephalospori    Prescriptions Prior to Admission  Medication Sig Dispense Refill Last Dose  . acetaminophen (TYLENOL) 500 MG tablet Take 1,000 mg by mouth every 6 (six) hours as needed for moderate pain.   Not Taking  . acetaZOLAMIDE (DIAMOX) 250 MG tablet Take 1 tablet twice a day for 1 week, then increase to 2 tablets twice a day 120 tablet 6   . aspirin EC 81 MG tablet Take 81 mg by mouth daily.   Taking  . enoxaparin (LOVENOX) 100 MG/ML injection Inject 100 mg  into the skin every 12 (twelve) hours.   Taking  . polyethylene glycol powder (GLYCOLAX/MIRALAX) powder Take 17 g by mouth daily as needed for constipation.   Taking  . prenatal vitamin w/FE, FA (NATACHEW) 29-1 MG CHEW chewable tablet Chew 1 tablet by mouth daily at 12 noon.   Taking    Review of Systems  Constitutional: Negative for chills and fever.  Gastrointestinal: Positive for abdominal pain. Negative for constipation (last BM today, normal but had pain with BM. ), diarrhea, nausea and vomiting.  Genitourinary: Positive for vaginal bleeding (slowing down, "seems normal"). Negative for dysuria.   Physical Exam   Blood pressure 126/70, pulse 95, temperature 98.9 F  (37.2 C), temperature source Oral, resp. rate 18, height 5' (1.524 m), weight 169 lb (76.7 kg), last menstrual period 03/13/2016, SpO2 100 %, unknown if currently breastfeeding.  Physical Exam  Nursing note and vitals reviewed. Constitutional: She is oriented to person, place, and time. She appears well-developed and well-nourished. No distress.  HENT:  Head: Normocephalic.  Cardiovascular: Normal rate.   Respiratory: Effort normal.  GI: Soft. There is no tenderness. There is no rebound.  Incision: C/D/I with steri-strips applied   Musculoskeletal: Normal range of motion.  Neurological: She is alert and oriented to person, place, and time.  Skin: Skin is warm and dry.  Psychiatric: She has a normal mood and affect.   MAU Course  Procedures  MDM Patient has had ibuprofen for her headache. B/P normal here.    Assessment and Plan   1. Colicky LLQ abdominal pain   2. Postpartum care and examination    DC home Comfort measures reviewed  Postpartum care reviewed  RX: none  Return to MAU as needed  Follow-up Information    [facility].   Specialty:  Obstetrics and Gynecology Contact information: [address] [place] [id number] [phone]           Tawnya CrookHogan, Heather Donovan 12/23/2016, 11:41 PM

## 2016-12-23 NOTE — MAU Note (Signed)
Pt reports C/S one week ago, presents with complaint of sharp shooting pain in her lower back and in her incisional area when ever she has a BM.

## 2017-02-28 ENCOUNTER — Ambulatory Visit: Payer: Medicaid Other | Admitting: Women's Health

## 2017-03-01 ENCOUNTER — Encounter: Payer: Self-pay | Admitting: Women's Health

## 2017-03-01 ENCOUNTER — Ambulatory Visit (INDEPENDENT_AMBULATORY_CARE_PROVIDER_SITE_OTHER): Payer: Medicaid Other | Admitting: Women's Health

## 2017-03-01 VITALS — BP 132/100 | HR 66 | Wt 175.0 lb

## 2017-03-01 DIAGNOSIS — Z30430 Encounter for insertion of intrauterine contraceptive device: Secondary | ICD-10-CM

## 2017-03-01 DIAGNOSIS — Z3202 Encounter for pregnancy test, result negative: Secondary | ICD-10-CM | POA: Diagnosis not present

## 2017-03-01 DIAGNOSIS — Z86711 Personal history of pulmonary embolism: Secondary | ICD-10-CM

## 2017-03-01 LAB — POCT URINE PREGNANCY: Preg Test, Ur: NEGATIVE

## 2017-03-01 NOTE — Patient Instructions (Signed)
 Nothing in vagina for 3 days (no sex, douching, tampons, etc...)  Check your strings once a month to make sure you can feel them, if you are not able to please let us know  If you develop a fever of 100.4 or more in the next few weeks, or if you develop severe abdominal pain, please let us know  Use a backup method of birth control, such as condoms, for 2 weeks   Levonorgestrel intrauterine device (IUD) What is this medicine? LEVONORGESTREL IUD (LEE voe nor jes trel) is a contraceptive (birth control) device. The device is placed inside the uterus by a healthcare professional. It is used to prevent pregnancy. This device can also be used to treat heavy bleeding that occurs during your period. This medicine may be used for other purposes; ask your health care provider or pharmacist if you have questions. COMMON BRAND NAME(S): Kyleena, LILETTA, Mirena, Skyla What should I tell my health care provider before I take this medicine? They need to know if you have any of these conditions: -abnormal Pap smear -cancer of the breast, uterus, or cervix -diabetes -endometritis -genital or pelvic infection now or in the past -have more than one sexual partner or your partner has more than one partner -heart disease -history of an ectopic or tubal pregnancy -immune system problems -IUD in place -liver disease or tumor -problems with blood clots or take blood-thinners -seizures -use intravenous drugs -uterus of unusual shape -vaginal bleeding that has not been explained -an unusual or allergic reaction to levonorgestrel, other hormones, silicone, or polyethylene, medicines, foods, dyes, or preservatives -pregnant or trying to get pregnant -breast-feeding How should I use this medicine? This device is placed inside the uterus by a health care professional. Talk to your pediatrician regarding the use of this medicine in children. Special care may be needed. Overdosage: If you think you have  taken too much of this medicine contact a poison control center or emergency room at once. NOTE: This medicine is only for you. Do not share this medicine with others. What if I miss a dose? This does not apply. Depending on the brand of device you have inserted, the device will need to be replaced every 3 to 5 years if you wish to continue using this type of birth control. What may interact with this medicine? Do not take this medicine with any of the following medications: -amprenavir -bosentan -fosamprenavir This medicine may also interact with the following medications: -aprepitant -armodafinil -barbiturate medicines for inducing sleep or treating seizures -bexarotene -boceprevir -griseofulvin -medicines to treat seizures like carbamazepine, ethotoin, felbamate, oxcarbazepine, phenytoin, topiramate -modafinil -pioglitazone -rifabutin -rifampin -rifapentine -some medicines to treat HIV infection like atazanavir, efavirenz, indinavir, lopinavir, nelfinavir, tipranavir, ritonavir -St. John's wort -warfarin This list may not describe all possible interactions. Give your health care provider a list of all the medicines, herbs, non-prescription drugs, or dietary supplements you use. Also tell them if you smoke, drink alcohol, or use illegal drugs. Some items may interact with your medicine. What should I watch for while using this medicine? Visit your doctor or health care professional for regular check ups. See your doctor if you or your partner has sexual contact with others, becomes HIV positive, or gets a sexual transmitted disease. This product does not protect you against HIV infection (AIDS) or other sexually transmitted diseases. You can check the placement of the IUD yourself by reaching up to the top of your vagina with clean fingers to feel the threads. Do   not pull on the threads. It is a good habit to check placement after each menstrual period. Call your doctor right away if  you feel more of the IUD than just the threads or if you cannot feel the threads at all. The IUD may come out by itself. You may become pregnant if the device comes out. If you notice that the IUD has come out use a backup birth control method like condoms and call your health care provider. Using tampons will not change the position of the IUD and are okay to use during your period. This IUD can be safely scanned with magnetic resonance imaging (MRI) only under specific conditions. Before you have an MRI, tell your healthcare provider that you have an IUD in place, and which type of IUD you have in place. What side effects may I notice from receiving this medicine? Side effects that you should report to your doctor or health care professional as soon as possible: -allergic reactions like skin rash, itching or hives, swelling of the face, lips, or tongue -fever, flu-like symptoms -genital sores -high blood pressure -no menstrual period for 6 weeks during use -pain, swelling, warmth in the leg -pelvic pain or tenderness -severe or sudden headache -signs of pregnancy -stomach cramping -sudden shortness of breath -trouble with balance, talking, or walking -unusual vaginal bleeding, discharge -yellowing of the eyes or skin Side effects that usually do not require medical attention (report to your doctor or health care professional if they continue or are bothersome): -acne -breast pain -change in sex drive or performance -changes in weight -cramping, dizziness, or faintness while the device is being inserted -headache -irregular menstrual bleeding within first 3 to 6 months of use -nausea This list may not describe all possible side effects. Call your doctor for medical advice about side effects. You may report side effects to FDA at 1-800-FDA-1088. Where should I keep my medicine? This does not apply. NOTE: This sheet is a summary. It may not cover all possible information. If you have  questions about this medicine, talk to your doctor, pharmacist, or health care provider.  2018 Elsevier/Gold Standard (2016-09-10 14:14:56)  

## 2017-03-01 NOTE — Progress Notes (Signed)
   Debra QuintCarissa Townsend is a 26 y.o. year old [id number] African American female who presents for placement of a Mirena IUD. [facility], had bilateral PE in July during the pregnancy, has completed her anticoagulation therapy.   Patient's last menstrual period was 02/25/2017. BP (!) 132/100 (BP Location: Left Arm, Patient Position: Sitting, Cuff Size: Normal)   Pulse 66   Wt 175 lb (79.4 kg)   LMP 02/25/2017   BMI 34.18 kg/m  Last sexual intercourse was >2wks ago, and pregnancy test today was neg  The risks and benefits of the method and placement have been thouroughly reviewed with the patient and all questions were answered.  Specifically the patient is aware of failure rate of 12/998, expulsion of the IUD and of possible perforation.  The patient is aware of irregular bleeding due to the method and understands the incidence of irregular bleeding diminishes with time.  Signed copy of informed consent in chart.   Time out was performed.  A graves speculum was placed in the vagina.  The cervix was visualized, prepped using Betadine, and grasped with a single tooth tenaculum. The uterus was found to be anteroflexed and it sounded to 8 cm.  Mirena IUD placed per manufacturer's recommendations.   The strings were trimmed to 3 cm.  Sonogram was performed and the proper placement of the IUD was verified via transvaginal u/s.   The patient was given post procedure instructions, including signs and symptoms of infection and to check for the strings after each menses or each month, and refraining from intercourse or anything in the vagina for 3 days.  She was given a Mirena care card with date IUD placed, and date IUD to be removed.  She is scheduled for a f/u appointment in 4 weeks.  [name], [name] CNM, [facility]NP-BC 03/01/2017 4:54 PM

## 2017-03-29 ENCOUNTER — Ambulatory Visit: Payer: Medicaid Other | Admitting: Women's Health

## 2017-04-26 ENCOUNTER — Ambulatory Visit: Payer: Medicaid Other | Admitting: Women's Health

## 2017-04-28 ENCOUNTER — Emergency Department (HOSPITAL_COMMUNITY)
Admission: EM | Admit: 2017-04-28 | Discharge: 2017-04-28 | Disposition: A | Discharge status: Home / Self Care | Payer: Medicaid Other | Attending: Emergency Medicine | Admitting: Emergency Medicine

## 2017-04-28 ENCOUNTER — Encounter (HOSPITAL_COMMUNITY): Payer: Self-pay | Admitting: Emergency Medicine

## 2017-04-28 DIAGNOSIS — Z202 Contact with and (suspected) exposure to infections with a predominantly sexual mode of transmission: Secondary | ICD-10-CM

## 2017-04-28 DIAGNOSIS — Z7982 Long term (current) use of aspirin: Secondary | ICD-10-CM | POA: Insufficient documentation

## 2017-04-28 DIAGNOSIS — Z79899 Other long term (current) drug therapy: Secondary | ICD-10-CM | POA: Insufficient documentation

## 2017-04-28 DIAGNOSIS — N898 Other specified noninflammatory disorders of vagina: Secondary | ICD-10-CM

## 2017-04-28 DIAGNOSIS — I10 Essential (primary) hypertension: Secondary | ICD-10-CM | POA: Insufficient documentation

## 2017-04-28 LAB — WET PREP, GENITAL
SPERM: NONE SEEN
Trich, Wet Prep: NONE SEEN
YEAST WET PREP: NONE SEEN

## 2017-04-28 MED ORDER — LIDOCAINE HCL (PF) 1 % IJ SOLN
INTRAMUSCULAR | Status: AC
  Administered 2017-04-28: 0.9 mL
  Filled 2017-04-28: qty 5

## 2017-04-28 MED ORDER — CEFTRIAXONE SODIUM 250 MG IJ SOLR
250.0000 mg | Freq: Once | INTRAMUSCULAR | Status: AC
  Administered 2017-04-28: 250 mg via INTRAMUSCULAR
  Filled 2017-04-28: qty 250

## 2017-04-28 MED ORDER — AZITHROMYCIN 250 MG PO TABS
1000.0000 mg | ORAL_TABLET | Freq: Once | ORAL | Status: AC
Start: 2017-04-28 — End: 2017-04-28
  Administered 2017-04-28: 1000 mg via ORAL
  Filled 2017-04-28: qty 4

## 2017-04-28 NOTE — ED Provider Notes (Signed)
[facility] DEPT Provider Note   CSN: [id number] Arrival date & time: 04/28/17  1539     History   Chief Complaint Chief Complaint  Patient presents with  . Exposure to STD    HPI Debra Townsend is a 26 y.o. female.  The history is provided by the patient. No language interpreter was used.  Exposure to STD  This is a new problem. The problem occurs constantly. The problem has been gradually worsening. Pertinent negatives include no headaches. Nothing aggravates the symptoms. Nothing relieves the symptoms. She has tried nothing for the symptoms. The treatment provided no relief.  Pt reports her partner called her and told her she needed treatment for chlamydia.   Past Medical History:  Diagnosis Date  . Anxiety   . Chlamydia   . Costochondral chest pain 01/03/2014  . Depression    No treatment, being monitored by Dr. Emelda Fear  . Herpes simplex without mention of complication   . Hypertension   . Miscarriage 02/01/2014  . Pregnant 01/03/2014  . Pulmonary embolism (HCC)   . Urinary tract infection     Patient Active Problem List   Diagnosis Date Noted  . History of pulmonary embolism 03/01/2017  . Encounter for insertion of mirena IUD 03/01/2017  . 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 Suicidal ideations   . Miscarriage 02/01/2014  . Costochondral chest pain 01/03/2014  . Herpes simplex type 2 infection 02/20/2013  . Chlamydia 02/20/2013  . Mild or unspecified pre-eclampsia, postpartum condition or complication 11/21/2012    Past Surgical History:  Procedure Laterality Date  . CESAREAN SECTION    . CESAREAN SECTION  11/16/2012   Procedure: CESAREAN SECTION;  Surgeon: Catalina Antigua, MD;  Location: [facility];  Service: Obstetrics;  Laterality: N/A;  . THERAPEUTIC ABORTION      OB History    Gravida Para Term Preterm AB Living   5 3 3  0 2  3   SAB TAB Ectopic Multiple Live Births   0 1 0   3       Home Medications    Prior to Admission medications   Medication Sig Start Date End Date Taking? Authorizing Provider  acetaminophen (TYLENOL) 500 MG tablet Take 1,000 mg by mouth every 6 (six) hours as needed for moderate pain.    [provider]  acetaZOLAMIDE (DIAMOX) 250 MG tablet Take 1 tablet twice a day for 1 week, then increase to 2 tablets twice a day Patient not taking: Reported on 03/01/2017 09/27/16   Van Clines, MD  aspirin EC 81 MG tablet Take 81 mg by mouth daily.    [provider]  enoxaparin (LOVENOX) 100 MG/ML injection Inject 100 mg into the skin every 12 (twelve) hours.    [provider]  ferrous sulfate 325 (65 FE) MG EC tablet Take 325 mg by mouth 3 (three) times daily with meals.    [provider]  polyethylene glycol powder (GLYCOLAX/MIRALAX) powder Take 17 g by mouth daily as needed for constipation.    [provider]  prenatal vitamin w/FE, FA (NATACHEW) 29-1 MG CHEW chewable tablet Chew 1 tablet by mouth daily at 12 noon.    [provider]    Family History Family History  Problem Relation Age of Onset  . Hypertension Mother   . Heart disease Father   . Hypertension Father   . Heart disease Sister   .  Hypertension Maternal Grandfather   . Hypertension Paternal Grandmother   . Diabetes Paternal Grandmother   . Kidney disease Paternal Grandmother   . Liver disease Paternal Grandmother   . Cancer Other        Breast, ovarian  . Diabetes Other   . Hypertension Other   . Stroke Other   . Liver disease Paternal Aunt   . Other Neg Hx     Social History Social History  Substance Use Topics  . Smoking status: Never Smoker  . Smokeless tobacco: Never Used  . Alcohol use Yes     Comment: occasional     Allergies   Other and Penicillins   Review of Systems Review of Systems  Neurological: Negative for headaches.  All other  systems reviewed and are negative.    Physical Exam Updated Vital Signs BP 127/86 (BP Location: Right Arm)   Pulse 80   Temp 98.3 F (36.8 C) (Oral)   Resp 18   Ht 5' (1.524 m)   Wt 167 lb (75.8 kg)   SpO2 100%   BMI 32.61 kg/m   Physical Exam  Constitutional: She appears well-developed and well-nourished.  HENT:  Head: Normocephalic and atraumatic.  Eyes: Conjunctivae are normal. Pupils are equal, round, and reactive to light.  Neck: Normal range of motion. Neck supple.  Cardiovascular: Normal rate.   Abdominal: Soft. There is no tenderness.  Genitourinary: Vaginal discharge found.  Genitourinary Comments: Vaginal discharge,  Thick white,  Adnexa no masses,  Cervix nontender  Musculoskeletal: Normal range of motion.  Skin: Skin is warm.     ED Treatments / Results  Labs (all labs ordered are listed, but only abnormal results are displayed) Labs Reviewed  WET PREP, GENITAL - Abnormal; Notable for the following:       Result Value   Clue Cells Wet Prep HPF POC PRESENT (*)    WBC, Wet Prep HPF POC MANY (*)    All other components within normal limits  GC/CHLAMYDIA PROBE AMP ([place]) NOT AT [facility]    EKG  EKG Interpretation None       Radiology No results found.  Procedures Procedures (including critical care time)  Medications Ordered in ED Medications  cefTRIAXone (ROCEPHIN) injection 250 mg (250 mg Intramuscular Given 04/28/17 1718)  azithromycin (ZITHROMAX) tablet 1,000 mg (1,000 mg Oral Given 04/28/17 1719)  lidocaine (PF) (XYLOCAINE) 1 % injection (0.9 mLs  Given 04/28/17 1718)     Initial Impression / Assessment and Plan / ED Course  I have reviewed the triage vital signs and the nursing notes.  Pertinent labs & imaging results that were available during my care of the patient were reviewed by me and considered in my medical decision making (see chart for details).     Meds ordered this encounter  Medications  . cefTRIAXone (ROCEPHIN)  injection 250 mg    Order Specific Question:   Antibiotic Indication:    Answer:   STD  . azithromycin (ZITHROMAX) tablet 1,000 mg  . lidocaine (PF) (XYLOCAINE) 1 % injection    Raul DelAlston, Cicely   : cabinet override    Final Clinical Impressions(s) / ED Diagnoses   Final diagnoses:  Possible exposure to STD  Vaginal discharge    New Prescriptions Discharge Medication List as of 04/28/2017  5:34 PM    An After Visit Summary was printed and given to the patient.    Osie CheeksSofia, Leslie K, PA-C 04/28/17 2131    Lavera GuiseLiu, Dana Duo, MD 04/29/17 [phone]

## 2017-04-28 NOTE — ED Triage Notes (Signed)
Patient requesting STD check. Per patient contacted by former partner and told he was positive for chlamydia. Patient denies any signs or symptoms at this time.

## 2017-04-28 NOTE — Discharge Instructions (Signed)
Return if any problems.

## 2017-04-29 LAB — GC/CHLAMYDIA PROBE AMP (~~LOC~~) NOT AT ARMC
Chlamydia: POSITIVE — AB
Neisseria Gonorrhea: NEGATIVE

## 2017-05-04 ENCOUNTER — Other Ambulatory Visit: Payer: Medicaid Other | Admitting: Adult Health

## 2017-05-27 ENCOUNTER — Emergency Department (HOSPITAL_COMMUNITY)
Admission: EM | Admit: 2017-05-27 | Discharge: 2017-05-27 | Disposition: A | Discharge status: Home / Self Care | Payer: Self-pay | Attending: Emergency Medicine | Admitting: Emergency Medicine

## 2017-05-27 ENCOUNTER — Encounter (HOSPITAL_COMMUNITY): Payer: Self-pay | Admitting: Emergency Medicine

## 2017-05-27 ENCOUNTER — Emergency Department (HOSPITAL_COMMUNITY): Payer: Self-pay

## 2017-05-27 DIAGNOSIS — I10 Essential (primary) hypertension: Secondary | ICD-10-CM | POA: Insufficient documentation

## 2017-05-27 DIAGNOSIS — K5900 Constipation, unspecified: Secondary | ICD-10-CM | POA: Insufficient documentation

## 2017-05-27 DIAGNOSIS — R1084 Generalized abdominal pain: Secondary | ICD-10-CM | POA: Insufficient documentation

## 2017-05-27 LAB — CBC WITH DIFFERENTIAL/PLATELET
BASOS ABS: 0 10*3/uL (ref 0.0–0.1)
Basophils Relative: 0 %
EOS PCT: 1 %
Eosinophils Absolute: 0.1 10*3/uL (ref 0.0–0.7)
HCT: 37.7 % (ref 36.0–46.0)
Hemoglobin: 11.6 g/dL — ABNORMAL LOW (ref 12.0–15.0)
LYMPHS PCT: 28 %
Lymphs Abs: 2.1 10*3/uL (ref 0.7–4.0)
MCH: 25.2 pg — ABNORMAL LOW (ref 26.0–34.0)
MCHC: 30.8 g/dL (ref 30.0–36.0)
MCV: 81.8 fL (ref 78.0–100.0)
MONO ABS: 0.4 10*3/uL (ref 0.1–1.0)
MONOS PCT: 5 %
Neutro Abs: 4.8 10*3/uL (ref 1.7–7.7)
Neutrophils Relative %: 66 %
PLATELETS: 221 10*3/uL (ref 150–400)
RBC: 4.61 MIL/uL (ref 3.87–5.11)
RDW: 15 % (ref 11.5–15.5)
WBC: 7.4 10*3/uL (ref 4.0–10.5)

## 2017-05-27 LAB — URINALYSIS, ROUTINE W REFLEX MICROSCOPIC
BILIRUBIN URINE: NEGATIVE
Glucose, UA: NEGATIVE mg/dL
Ketones, ur: NEGATIVE mg/dL
LEUKOCYTES UA: NEGATIVE
Nitrite: NEGATIVE
PH: 5 (ref 5.0–8.0)
Protein, ur: 30 mg/dL — AB
SPECIFIC GRAVITY, URINE: 1.029 (ref 1.005–1.030)

## 2017-05-27 LAB — COMPREHENSIVE METABOLIC PANEL
ALT: 18 U/L (ref 14–54)
AST: 18 U/L (ref 15–41)
Albumin: 3.8 g/dL (ref 3.5–5.0)
Alkaline Phosphatase: 97 U/L (ref 38–126)
Anion gap: 6 (ref 5–15)
BILIRUBIN TOTAL: 0.4 mg/dL (ref 0.3–1.2)
BUN: 14 mg/dL (ref 6–20)
CHLORIDE: 105 mmol/L (ref 101–111)
CO2: 27 mmol/L (ref 22–32)
CREATININE: 0.74 mg/dL (ref 0.44–1.00)
Calcium: 9 mg/dL (ref 8.9–10.3)
GFR calc Af Amer: >60 mL/min (ref >60)
GLUCOSE: 86 mg/dL (ref 65–99)
Potassium: 4 mmol/L (ref 3.5–5.1)
Sodium: 138 mmol/L (ref 135–145)
TOTAL PROTEIN: 7.7 g/dL (ref 6.5–8.1)

## 2017-05-27 LAB — PREGNANCY, URINE: PREG TEST UR: NEGATIVE

## 2017-05-27 LAB — LIPASE, BLOOD: LIPASE: 30 U/L (ref 11–51)

## 2017-05-27 MED ORDER — SENNOSIDES-DOCUSATE SODIUM 8.6-50 MG PO TABS: 1.0000 | ORAL_TABLET | Freq: Every day | ORAL | 0 refills | Status: AC

## 2017-05-27 MED ORDER — POLYETHYLENE GLYCOL 3350 17 G PO PACK: 17.0000 g | PACK | Freq: Every day | ORAL | 0 refills | Status: AC

## 2017-05-27 NOTE — ED Notes (Signed)
Pt made aware to return if symptoms worsen or if any life threatening symptoms occur.   

## 2017-05-27 NOTE — ED Provider Notes (Signed)
Emergency Department Provider Note   I have reviewed the triage vital signs and the nursing notes.   HISTORY  Chief Complaint Abdominal Pain   HPI Debra Townsend is a 26 y.o. female with PMH of HTN, PE, and constipation presents to the emergency pertinent for evaluation of right sided abdominal pain for the past 2 months. She reports say almost constant dull pain in that area that becomes more severe and sharp when she has a bowel movement. She reports some associated rectal discomfort. She has very irregular bowel movements and history of chronic constipation. She will occasionally try laxatives with little to no relief. No fevers or chills. She denies any dysuria, hesitancy, urgency. No vaginal bleeding or discharge. No modifying factors.    Past Medical History:  Diagnosis Date  . Anxiety   . Chlamydia   . Costochondral chest pain 01/03/2014  . Depression    No treatment, being monitored by Dr. Emelda Fear  . Herpes simplex without mention of complication   . Hypertension   . Miscarriage 02/01/2014  . Pregnant 01/03/2014  . Pulmonary embolism (HCC)   . Urinary tract infection     Patient Active Problem List   Diagnosis Date Noted  . History of pulmonary embolism 03/01/2017  . Encounter for insertion of mirena IUD 03/01/2017  . 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 Suicidal ideations   . Miscarriage 02/01/2014  . Costochondral chest pain 01/03/2014  . Herpes simplex type 2 infection 02/20/2013  . Chlamydia 02/20/2013  . Mild or unspecified pre-eclampsia, postpartum condition or complication 11/21/2012    Past Surgical History:  Procedure Laterality Date  . CESAREAN SECTION    . CESAREAN SECTION  11/16/2012   Procedure: CESAREAN SECTION;  Surgeon: Catalina Antigua, MD;  Location: [facility];  Service: Obstetrics;  Laterality: N/A;  . THERAPEUTIC  ABORTION      Current Outpatient Rx  . Order #: [id number] Class: Historical Med  . Order #: [id number] Class: Normal  . Order #: [id number] Class: Historical Med  . Order #: [id number] Class: Historical Med  . Order #: [id number] Class: Historical Med  . Order #: [id number] Class: Print  . Order #: [id number] Class: Historical Med  . Order #: [id number] Class: Print    Allergies Other and Penicillins  Family History  Problem Relation Age of Onset  . Hypertension Mother   . Heart disease Father   . Hypertension Father   . Heart disease Sister   . Hypertension Maternal Grandfather   . Hypertension Paternal Grandmother   . Diabetes Paternal Grandmother   . Kidney disease Paternal Grandmother   . Liver disease Paternal Grandmother   . Cancer Other        Breast, ovarian  . Diabetes Other   . Hypertension Other   . Stroke Other   . Liver disease Paternal Aunt   . Other Neg Hx     Social History Social History  Substance Use Topics  . Smoking status: Never Smoker  . Smokeless tobacco: Never Used  . Alcohol use Yes     Comment: occasional    Review of Systems  Constitutional: No fever/chills Eyes: No visual changes. ENT: No sore throat. Cardiovascular: Denies chest pain. Respiratory: Denies shortness of breath. Gastrointestinal: Positive right sided abdominal pain.  No nausea, no vomiting.  No diarrhea. Positive constipation. Genitourinary: Negative for dysuria. Musculoskeletal: Negative for back pain. Skin: Negative for rash. Neurological: Negative for headaches, focal  weakness or numbness.  10-point ROS otherwise negative.  ____________________________________________   PHYSICAL EXAM:  VITAL SIGNS: ED Triage Vitals  Enc Vitals Group     BP 05/27/17 1032 (!) 121/92     Pulse Rate 05/27/17 1032 80     Resp 05/27/17 1032 16     Temp 05/27/17 1032 98.7 F (37.1 C)     Temp Source 05/27/17 1032 Oral     SpO2 05/27/17 1032 100 %     Weight 05/27/17 1032 160 lb (72.6  kg)     Height 05/27/17 1032 5' (1.524 m)     Pain Score 05/27/17 1031 6   Constitutional: Alert and oriented. Well appearing and in no acute distress. Eyes: Conjunctivae are normal.  Head: Atraumatic. Nose: No congestion/rhinnorhea. Mouth/Throat: Mucous membranes are moist.  Oropharynx non-erythematous. Neck: No stridor.   Cardiovascular: Normal rate, regular rhythm. Good peripheral circulation. Grossly normal heart sounds.   Respiratory: Normal respiratory effort.  No retractions. Lungs CTAB. Gastrointestinal: Soft with mild right abdominal tenderness. No rebound or guarding. Negative Murphy's sign. No CVA tenderness. Mild distention.  Musculoskeletal: No lower extremity tenderness nor edema. No gross deformities of extremities. Neurologic:  Normal speech and language. No gross focal neurologic deficits are appreciated.  Skin:  Skin is warm, dry and intact. No rash noted.  ____________________________________________   LABS (all labs ordered are listed, but only abnormal results are displayed)  Labs Reviewed  CBC WITH DIFFERENTIAL/PLATELET - Abnormal; Notable for the following:       Result Value   Hemoglobin 11.6 (*)    MCH 25.2 (*)    All other components within normal limits  URINALYSIS, ROUTINE W REFLEX MICROSCOPIC - Abnormal; Notable for the following:    APPearance HAZY (*)    Hgb urine dipstick SMALL (*)    Protein, ur 30 (*)    Bacteria, UA RARE (*)    Squamous Epithelial / LPF 6-30 (*)    All other components within normal limits  COMPREHENSIVE METABOLIC PANEL  LIPASE, BLOOD  PREGNANCY, URINE   ____________________________________________  RADIOLOGY  Dg Abdomen Acute W/chest  Result Date: 05/27/2017 CLINICAL DATA:  Right lower quadrant pain when attempting to have a bowel movement. Also perianal pain during defecation. Duration of symptoms 1-2 months. EXAM: DG ABDOMEN ACUTE W/ 1V CHEST COMPARISON:  Abdominal and pelvic CT scan dated June 23, 2015. FINDINGS: The  lungs are well-expanded and clear. The heart and mediastinum are normal. There is no pleural effusion. The bony thorax is unremarkable. Within the abdomen the colonic stool burden is increased diffusely. There is no evidence of a fecal impaction. No small bowel obstructive pattern is observed. An IUD is present. There are no abnormal soft tissue calcifications. The bony structures are unremarkable. IMPRESSION: There is no acute cardiopulmonary abnormality. Increased colonic stool burden compatible with constipation in the appropriate clinical setting. Electronically Signed   By: David  [place] M.D.   On: 05/27/2017 12:19    ____________________________________________   PROCEDURES  Procedure(s) performed:   Procedures  None ____________________________________________   INITIAL IMPRESSION / ASSESSMENT AND PLAN / ED COURSE  Pertinent labs & imaging results that were available during my care of the patient were reviewed by me and considered in my medical decision making (see chart for details).  Patient presents to the emergency department for evaluation of 2 months of right-sided abdominal pain made worse with bowel movements. She has history of constipation. Plan for plain film of the abdomen along with labs and urine  test for pregnancy. No pelvic symptoms and just vaginal discharge or bleeding. Low suspicion at this represents a PID especially with it being made worse with bowel movements. Plan for reassessment after labs. There is no focal tenderness in the abdomen to suggest appendicitis and with 2 months of symptoms feel that this diagnosis is extremely unlikely.   12:50 PM Patient's labs, urinalysis, x-rays are negative for acute process. The patient does have constipation noted on x-ray. Clinically this correlates well with the patient's symptoms. Plan to treat the patient with oral laxatives and enema as needed. Discussed bowel regimen in detail with the patient along with dehydration  precautions. She is currently establishing care with a primary care physician but has not seen them in office yet. I encouraged PCP follow-up and discussed return precautions to the emergency department in detail.  At this time, I do not feel there is any life-threatening condition present. I have reviewed and discussed all results (EKG, imaging, lab, urine as appropriate), exam findings with patient. I have reviewed nursing notes and appropriate previous records.  I feel the patient is safe to be discharged home without further emergent workup. Discussed usual and customary return precautions. Patient and family (if present) verbalize understanding and are comfortable with this plan.  Patient will follow-up with their primary care provider. If they do not have a primary care provider, information for follow-up has been provided to them. All questions have been answered.  ____________________________________________  FINAL CLINICAL IMPRESSION(S) / ED DIAGNOSES  Final diagnoses:  Generalized abdominal pain  Constipation, unspecified constipation type     MEDICATIONS GIVEN DURING THIS VISIT:  Medications - No data to display   NEW OUTPATIENT MEDICATIONS STARTED DURING THIS VISIT:  New Prescriptions   POLYETHYLENE GLYCOL (MIRALAX) PACKET    Take 17 g by mouth daily.   SENNA-DOCUSATE (SENOKOT-S) 8.6-50 MG TABLET    Take 1 tablet by mouth daily.      Note:  This document was prepared using Dragon voice recognition software and may include unintentional dictation errors.  Alona BeneJoshua Seydou Hearns, MD Emergency Medicine   Sayeed Weatherall, Arlyss RepressJoshua G, MD 05/27/17 1255

## 2017-05-27 NOTE — Discharge Instructions (Signed)
You were seen in the emergency department today for constipation.  We recommend that you use one or more of the following over-the-counter medications in the order described: °  °1)  Colace (or Dulcolax) 100 mg:  This is a stool softener, and you may take it once or twice a day as needed. °2)  Senna tablets:  This is a bowel stimulant that will help "push" out your stool. It is the next step to add after you have tried a stool softener. °3)  Miralax (powder):  This medication works by drawing additional fluid into your intestines and helps to flush out your stool.  Mix the powder with water or juice according to label instructions.  It may help if the Colace and Senna are not sufficient, but you must be sure to use the recommended amount of water or juice when you mix up the powder. °Remember that narcotic pain medications are constipating, so avoid them or minimize their use.  Drink plenty of fluids. ° °Please return to the Emergency Department immediately if you develop new or worsening symptoms that concern you, such as (but not limited to) fever > 101 degrees, severe abdominal pain, or persistent vomiting. ° ° °Constipation °Constipation is when a person has fewer than three bowel movements a week, has difficulty having a bowel movement, or has stools that are dry, hard, or larger than normal. As people grow older, constipation is more common. If you try to fix constipation with medicines that make you have a bowel movement (laxatives), the problem may get worse. Kayron Kalmar-term laxative use may cause the muscles of the colon to become weak. A low-fiber diet, not taking in enough fluids, and taking certain medicines may make constipation worse.  °CAUSES  °Certain medicines, such as antidepressants, pain medicine, iron supplements, antacids, and water pills.   °Certain diseases, such as diabetes, irritable bowel syndrome (IBS), thyroid disease, or depression.   °Not drinking enough water.   °Not eating enough  fiber-rich foods.   °Stress or travel.   °Lack of physical activity or exercise.   °Ignoring the urge to have a bowel movement.   °Using laxatives too much.   °SIGNS AND SYMPTOMS  °Having fewer than three bowel movements a week.   °Straining to have a bowel movement.   °Having stools that are hard, dry, or larger than normal.   °Feeling full or bloated.   °Pain in the lower abdomen.   °Not feeling relief after having a bowel movement.   °DIAGNOSIS  °Your health care provider will take a medical history and perform a physical exam. Further testing may be done for severe constipation. Some tests may include: °A barium enema X-ray to examine your rectum, colon, and, sometimes, your small intestine.   °A sigmoidoscopy to examine your lower colon.   °A colonoscopy to examine your entire colon. °TREATMENT  °Treatment will depend on the severity of your constipation and what is causing it. Some dietary treatments include drinking more fluids and eating more fiber-rich foods. Lifestyle treatments may include regular exercise. If these diet and lifestyle recommendations do not help, your health care provider may recommend taking over-the-counter laxative medicines to help you have bowel movements. Prescription medicines may be prescribed if over-the-counter medicines do not work.  °HOME CARE INSTRUCTIONS  °Eat foods that have a lot of fiber, such as fruits, vegetables, whole grains, and beans. °Limit foods high in fat and processed sugars, such as french fries, hamburgers, cookies, candies, and soda.   °A   fiber supplement may be added to your diet if you cannot get enough fiber from foods.   °Drink enough fluids to keep your urine clear or pale yellow.   °Exercise regularly or as directed by your health care provider.   °Go to the restroom when you have the urge to go. Do not hold it.   °Only take over-the-counter or prescription medicines as directed by your health care provider. Do not take other medicines for constipation  without talking to your health care provider first.   °SEEK IMMEDIATE MEDICAL CARE IF:  °You have bright red blood in your stool.   °Your constipation lasts for more than 4 days or gets worse.   °You have abdominal or rectal pain.   °You have thin, pencil-like stools.   °You have unexplained weight loss. °MAKE SURE YOU:  °Understand these instructions. °Will watch your condition. °Will get help right away if you are not doing well or get worse. °Document Released: 08/27/2004 Document Revised: 12/04/2013 Document Reviewed: 09/10/2013 °[employer]® Patient Information ©2015 [employer]. This information is not intended to replace advice given to you by your health care provider. Make sure you discuss any questions you have with your health care provider. ° ° ° °

## 2017-05-27 NOTE — ED Triage Notes (Signed)
Patient complains of sharp pain to right lower abdomen with bowel movements x 2 months.

## 2017-07-12 ENCOUNTER — Ambulatory Visit (HOSPITAL_COMMUNITY)
Admission: EM | Admit: 2017-07-12 | Discharge: 2017-07-12 | Disposition: A | Discharge status: Home / Self Care | Payer: Self-pay | Attending: Emergency Medicine | Admitting: Emergency Medicine

## 2017-07-12 ENCOUNTER — Encounter (HOSPITAL_COMMUNITY): Payer: Self-pay | Admitting: Emergency Medicine

## 2017-07-12 DIAGNOSIS — L02412 Cutaneous abscess of left axilla: Secondary | ICD-10-CM

## 2017-07-12 MED ORDER — DOXYCYCLINE HYCLATE 100 MG PO CAPS: 100.0000 mg | ORAL_CAPSULE | Freq: Two times a day (BID) | ORAL | 0 refills | Status: DC

## 2017-07-12 MED ORDER — HYDROCODONE-ACETAMINOPHEN 5-325 MG PO TABS: 2.0000 | ORAL_TABLET | ORAL | 0 refills | Status: DC | PRN

## 2017-07-12 NOTE — ED Triage Notes (Signed)
Pt reports an abscess under her left axillary that she first noticed on Saturday.  She has been applying warm compresses to it and denies any fever.

## 2017-07-12 NOTE — ED Provider Notes (Signed)
CSN: [id number]     Arrival date & time 07/12/17  1207 History   First MD Initiated Contact with Patient 07/12/17 1235     Chief Complaint  Patient presents with  . Abscess   (Consider location/radiation/quality/duration/timing/severity/associated sxs/prior Treatment) Patient c/o abscess left axilla for last 5 days.  She is c/o left axillary pain.    Abscess  Location:  Shoulder/arm Shoulder/arm abscess location:  L axilla Size:  5 cm Abscess quality: induration, painful and redness   Red streaking: no   Duration:  5 days Progression:  Worsening Pain details:    Quality:  Dull, throbbing and aching   Severity:  Moderate   Duration:  5 days   Timing:  Constant   Progression:  Worsening Chronicity:  New Relieved by:  None tried Worsened by:  Nothing Ineffective treatments:  None tried   Past Medical History:  Diagnosis Date  . Anxiety   . Chlamydia   . Costochondral chest pain 01/03/2014  . Depression    No treatment, being monitored by Dr. Emelda Fear  . Herpes simplex without mention of complication   . Hypertension   . Miscarriage 02/01/2014  . Pregnant 01/03/2014  . Pulmonary embolism (HCC)   . Urinary tract infection    Past Surgical History:  Procedure Laterality Date  . CESAREAN SECTION    . CESAREAN SECTION  11/16/2012   Procedure: CESAREAN SECTION;  Surgeon: Catalina Antigua, MD;  Location: [facility];  Service: Obstetrics;  Laterality: N/A;  . THERAPEUTIC ABORTION     Family History  Problem Relation Age of Onset  . Hypertension Mother   . Heart disease Father   . Hypertension Father   . Heart disease Sister   . Hypertension Maternal Grandfather   . Hypertension Paternal Grandmother   . Diabetes Paternal Grandmother   . Kidney disease Paternal Grandmother   . Liver disease Paternal Grandmother   . Cancer Other        Breast, ovarian  . Diabetes Other   . Hypertension Other   . Stroke Other   . Liver disease Paternal Aunt   . Other Neg Hx    Social  History  Substance Use Topics  . Smoking status: Never Smoker  . Smokeless tobacco: Never Used  . Alcohol use Yes     Comment: occasional   OB History    Gravida Para Term Preterm AB Living   5 3 3  0 2 3   SAB TAB Ectopic Multiple Live Births   0 1 0   3     Review of Systems  Constitutional: Negative.   HENT: Negative.   Eyes: Negative.   Respiratory: Negative.   Cardiovascular: Negative.   Gastrointestinal: Negative.   Endocrine: Negative.   Genitourinary: Negative.   Musculoskeletal: Negative.   Skin: Positive for wound.  Allergic/Immunologic: Negative.   Neurological: Negative.   Hematological: Negative.   Psychiatric/Behavioral: Negative.     Allergies  Other and Penicillins  Home Medications   Prior to Admission medications   Medication Sig Start Date End Date Taking? Authorizing Provider  acetaminophen (TYLENOL) 500 MG tablet Take 1,000 mg by mouth every 6 (six) hours as needed for moderate pain.    [provider]  acetaZOLAMIDE (DIAMOX) 250 MG tablet Take 1 tablet twice a day for 1 week, then increase to 2 tablets twice a day Patient not taking: Reported on 03/01/2017 09/27/16   Van Clines, MD  aspirin EC 81 MG tablet Take 81 mg by mouth  daily.    [provider]  doxycycline (VIBRAMYCIN) 100 MG capsule Take 1 capsule (100 mg total) by mouth 2 (two) times daily. 07/12/17   Deatra Canterxford, Edvin Albus J, FNP  enoxaparin (LOVENOX) 100 MG/ML injection Inject 100 mg into the skin every 12 (twelve) hours.    [provider]  ferrous sulfate 325 (65 FE) MG EC tablet Take 325 mg by mouth 3 (three) times daily with meals.    [provider]  HYDROcodone-acetaminophen (NORCO/VICODIN) 5-325 MG tablet Take 2 tablets by mouth every 4 (four) hours as needed. 07/12/17   Deatra Canterxford, Seichi Kaufhold J, FNP  polyethylene glycol [facility](MIRALAX) packet Take 17 g by mouth daily. 05/27/17   Long, Arlyss RepressJoshua G, MD  prenatal vitamin w/FE, FA (NATACHEW) 29-1 MG CHEW chewable tablet  Chew 1 tablet by mouth daily at 12 noon.    [provider]   Meds Ordered and Administered this Visit  Medications - No data to display  BP 122/73 (BP Location: Right Arm)   Pulse 100   Temp 97.9 F (36.6 C) (Oral)   SpO2 100%  No data found.   Physical Exam  Constitutional: She is oriented to person, place, and time. She appears well-developed and well-nourished.  HENT:  Head: Normocephalic and atraumatic.  Eyes: Pupils are equal, round, and reactive to light. Conjunctivae and EOM are normal.  Neck: Normal range of motion. Neck supple.  Cardiovascular: Normal rate, regular rhythm and normal heart sounds.   Pulmonary/Chest: Effort normal and breath sounds normal.  Neurological: She is alert and oriented to person, place, and time.  Skin:  Left axilla with approx 5 cm abscess erythematous fluctuant and tender  Nursing note and vitals reviewed.   Urgent Care Course     .Marland Kitchen.Incision and Drainage Date/Time: 07/12/2017 1:24 PM Performed by: Deatra CanterXFORD, Mariona Scholes J Authorized by: Domenick GongMORTENSON, ASHLEY   Consent:    Consent obtained:  Verbal   Consent given by:  Patient   Risks discussed:  Bleeding and incomplete drainage   Alternatives discussed:  No treatment Location:    Type:  Abscess   Size:  5 cm Pre-procedure details:    Skin preparation:  Betadine Anesthesia (see MAR for exact dosages):    Anesthesia method:  Local infiltration   Local anesthetic:  Lidocaine 1% w/o epi Procedure type:    Complexity:  Simple Procedure details:    Needle aspiration: no     Incision types:  Stab incision   Incision depth:  Dermal   Scalpel blade:  11   Wound management:  Probed and deloculated   Drainage:  Bloody and purulent   Drainage amount:  Moderate   Packing materials:  1/4 in iodoform gauze Post-procedure details:    Patient tolerance of procedure:  Tolerated well, no immediate complications Comments:     Bulky 4x4 dressing applied   (including critical care  time)  Labs Review Labs Reviewed - No data to display  Imaging Review No results found.   Visual Acuity Review  Right Eye Distance:   Left Eye Distance:   Bilateral Distance:    Right Eye Near:   Left Eye Near:    Bilateral Near:         MDM   1. Abscess of left axilla    Incision and Drainage   Follow up in 2 days for wound check  Doxycycline 100mg  one po bid x 10 days #20 Norco 5/325 one po q 6 hours prn pain #6      Jkayla Spiewak,  Anselm PancoastWilliam J, FNP 07/12/17 1326

## 2017-07-14 ENCOUNTER — Encounter (HOSPITAL_COMMUNITY): Payer: Self-pay | Admitting: *Deleted

## 2017-07-14 ENCOUNTER — Emergency Department (HOSPITAL_COMMUNITY)
Admission: EM | Admit: 2017-07-14 | Discharge: 2017-07-14 | Disposition: A | Discharge status: Home / Self Care | Payer: Self-pay | Attending: Physician Assistant | Admitting: Physician Assistant

## 2017-07-14 DIAGNOSIS — I10 Essential (primary) hypertension: Secondary | ICD-10-CM | POA: Insufficient documentation

## 2017-07-14 DIAGNOSIS — Z79899 Other long term (current) drug therapy: Secondary | ICD-10-CM | POA: Insufficient documentation

## 2017-07-14 DIAGNOSIS — L02412 Cutaneous abscess of left axilla: Secondary | ICD-10-CM | POA: Insufficient documentation

## 2017-07-14 DIAGNOSIS — Z5189 Encounter for other specified aftercare: Secondary | ICD-10-CM

## 2017-07-14 DIAGNOSIS — Z7982 Long term (current) use of aspirin: Secondary | ICD-10-CM | POA: Insufficient documentation

## 2017-07-14 MED ORDER — LIDOCAINE HCL 1 % IJ SOLN
INTRAMUSCULAR | Status: AC
  Administered 2017-07-14: 20 mL
  Filled 2017-07-14: qty 20

## 2017-07-14 NOTE — ED Triage Notes (Signed)
Pt here to check abscess that was drained 2 days ago at an urgent care. Pt states the area is still painful and draining. Pt was unsure if she should take out packing yet. Pt states she had some chills but did not take her temperature.

## 2017-07-14 NOTE — Discharge Instructions (Signed)
Take the antibiotics at home that you have. Return wth fever.

## 2017-07-14 NOTE — ED Provider Notes (Signed)
[facility]-EMERGENCY DEPT Provider Note   CSN: [id number] Arrival date & time: 07/14/17  2227     History   Chief Complaint Chief Complaint  Patient presents with  . Wound Check    HPI Debra Townsend is a 26 y.o. female.  HPI   Patient is a 26 year old female presenting for wound check. Patient had abscess drained a couple days ago. Patient still has draining. Patient has not been taking any of the antibiotics.  Past Medical History:  Diagnosis Date  . Anxiety   . Chlamydia   . Costochondral chest pain 01/03/2014  . Depression    No treatment, being monitored by Dr. Emelda FearFerguson  . Herpes simplex without mention of complication   . Hypertension   . Miscarriage 02/01/2014  . Pregnant 01/03/2014  . Pulmonary embolism (HCC)   . Urinary tract infection     Patient Active Problem List   Diagnosis Date Noted  . History of pulmonary embolism 03/01/2017  . Encounter for insertion of mirena IUD 03/01/2017  . 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 Suicidal ideations   . Miscarriage 02/01/2014  . Costochondral chest pain 01/03/2014  . Herpes simplex type 2 infection 02/20/2013  . Chlamydia 02/20/2013  . Mild or unspecified pre-eclampsia, postpartum condition or complication 11/21/2012    Past Surgical History:  Procedure Laterality Date  . CESAREAN SECTION    . CESAREAN SECTION  11/16/2012   Procedure: CESAREAN SECTION;  Surgeon: Catalina AntiguaPeggy Constant, MD;  Location: [facility];  Service: Obstetrics;  Laterality: N/A;  . THERAPEUTIC ABORTION      OB History    Gravida Para Term Preterm AB Living   5 3 3  0 2 3   SAB TAB Ectopic Multiple Live Births   0 1 0   3       Home Medications    Prior to Admission medications   Medication Sig Start Date End Date Taking? Authorizing Provider  acetaminophen (TYLENOL) 500 MG tablet Take 1,000 mg by mouth every 6 (six)  hours as needed for moderate pain.    [provider]  acetaZOLAMIDE (DIAMOX) 250 MG tablet Take 1 tablet twice a day for 1 week, then increase to 2 tablets twice a day Patient not taking: Reported on 03/01/2017 09/27/16   Van ClinesAquino, Karen M, MD  aspirin EC 81 MG tablet Take 81 mg by mouth daily.    [provider]  doxycycline (VIBRAMYCIN) 100 MG capsule Take 1 capsule (100 mg total) by mouth 2 (two) times daily. 07/12/17   Deatra Canterxford, William J, FNP  enoxaparin (LOVENOX) 100 MG/ML injection Inject 100 mg into the skin every 12 (twelve) hours.    [provider]  ferrous sulfate 325 (65 FE) MG EC tablet Take 325 mg by mouth 3 (three) times daily with meals.    [provider]  HYDROcodone-acetaminophen (NORCO/VICODIN) 5-325 MG tablet Take 2 tablets by mouth every 4 (four) hours as needed. 07/12/17   Deatra Canterxford, William J, FNP  polyethylene glycol [facility](MIRALAX) packet Take 17 g by mouth daily. 05/27/17   Long, Arlyss RepressJoshua G, MD  prenatal vitamin w/FE, FA (NATACHEW) 29-1 MG CHEW chewable tablet Chew 1 tablet by mouth daily at 12 noon.    [provider]    Family History Family History  Problem Relation Age of Onset  . Hypertension Mother   . Heart disease Father   . Hypertension Father   .  Heart disease Sister   . Hypertension Maternal Grandfather   . Hypertension Paternal Grandmother   . Diabetes Paternal Grandmother   . Kidney disease Paternal Grandmother   . Liver disease Paternal Grandmother   . Cancer Other        Breast, ovarian  . Diabetes Other   . Hypertension Other   . Stroke Other   . Liver disease Paternal Aunt   . Other Neg Hx     Social History Social History  Substance Use Topics  . Smoking status: Never Smoker  . Smokeless tobacco: Never Used  . Alcohol use Yes     Comment: occasional     Allergies   Other and Penicillins   Review of Systems Review of Systems  Constitutional: Negative for fatigue and fever.  Respiratory: Negative  for shortness of breath.      Physical Exam Updated Vital Signs BP 119/80 (BP Location: Right Arm)   Pulse 86   Temp 98.4 F (36.9 C) (Oral)   Resp 18   SpO2 100%   Physical Exam  Constitutional: She is oriented to person, place, and time. She appears well-developed and well-nourished.  HENT:  Head: Normocephalic and atraumatic.  Eyes: Right eye exhibits no discharge.  Cardiovascular: Normal rate, regular rhythm and normal heart sounds.   No murmur heard. Musculoskeletal:  Abscess in the left axilla. Patient is indurated area. No fluctuance.  Neurological: She is oriented to person, place, and time.  Skin: Skin is warm and dry. She is not diaphoretic.  Psychiatric: She has a normal mood and affect.  Nursing note and vitals reviewed.    ED Treatments / Results  Labs (all labs ordered are listed, but only abnormal results are displayed) Labs Reviewed - No data to display  EKG  EKG Interpretation None       Radiology No results found.  Procedures Procedures (including critical care time)  Medications Ordered in ED Medications  lidocaine (XYLOCAINE) 1 % (with pres) injection (not administered)     Initial Impression / Assessment and Plan / ED Course  I have reviewed the triage vital signs and the nursing notes.  Pertinent labs & imaging results that were available during my care of the patient were reviewed by me and considered in my medical decision making (see chart for details).    Patient is a 26 year old female presenting for wound check. I replaced the packing after numbing up the patient and separating loculations a little bit more. Encouraged her to take the antibiotics and use warm compresses.No surrounding cellulitis.  INCISION AND DRAINAGE Performed by: Arlana Hoveourteney L Vonna Brabson Consent: Verbal consent obtained. Risks and benefits: risks, benefits and alternatives were discussed Type: abscess  Body area: L axilla Anesthesia: local  infiltration  Incision was made with a scalpel.  Local anesthetic: lidocaine 1 w epinephrine  Anesthetic total: 5 ml  Complexity: complex Blunt dissection to break up loculations  Drainage: purulent  Drainage amount: none  Packing material: 1/4 in iodoform gauze  Patient tolerance: Patient tolerated the procedure well with no immediate complications.     Final Clinical Impressions(s) / ED Diagnoses   Final diagnoses:  None    New Prescriptions New Prescriptions   No medications on file     Abelino DerrickMackuen, Kalman Nylen Lyn, MD 07/14/17 2303

## 2017-08-08 ENCOUNTER — Emergency Department (HOSPITAL_COMMUNITY): Payer: Self-pay

## 2017-08-08 ENCOUNTER — Encounter (HOSPITAL_COMMUNITY): Payer: Self-pay | Admitting: Emergency Medicine

## 2017-08-08 ENCOUNTER — Emergency Department (HOSPITAL_COMMUNITY)
Admission: EM | Admit: 2017-08-08 | Discharge: 2017-08-09 | Disposition: A | Discharge status: Home / Self Care | Payer: Self-pay | Attending: Emergency Medicine | Admitting: Emergency Medicine

## 2017-08-08 DIAGNOSIS — M791 Myalgia: Secondary | ICD-10-CM | POA: Insufficient documentation

## 2017-08-08 DIAGNOSIS — I10 Essential (primary) hypertension: Secondary | ICD-10-CM | POA: Insufficient documentation

## 2017-08-08 DIAGNOSIS — L03311 Cellulitis of abdominal wall: Secondary | ICD-10-CM | POA: Insufficient documentation

## 2017-08-08 DIAGNOSIS — R5383 Other fatigue: Secondary | ICD-10-CM | POA: Insufficient documentation

## 2017-08-08 DIAGNOSIS — R509 Fever, unspecified: Secondary | ICD-10-CM

## 2017-08-08 LAB — CBC WITH DIFFERENTIAL/PLATELET
BASOS PCT: 0 %
Basophils Absolute: 0 10*3/uL (ref 0.0–0.1)
EOS ABS: 0 10*3/uL (ref 0.0–0.7)
Eosinophils Relative: 0 %
HCT: 37.1 % (ref 36.0–46.0)
Hemoglobin: 11.9 g/dL — ABNORMAL LOW (ref 12.0–15.0)
LYMPHS ABS: 1.4 10*3/uL (ref 0.7–4.0)
Lymphocytes Relative: 8 %
MCH: 26.6 pg (ref 26.0–34.0)
MCHC: 32.1 g/dL (ref 30.0–36.0)
MCV: 82.8 fL (ref 78.0–100.0)
MONOS PCT: 5 %
Monocytes Absolute: 0.8 10*3/uL (ref 0.1–1.0)
Neutro Abs: 14.8 10*3/uL — ABNORMAL HIGH (ref 1.7–7.7)
Neutrophils Relative %: 87 %
PLATELETS: 211 10*3/uL (ref 150–400)
RBC: 4.48 MIL/uL (ref 3.87–5.11)
RDW: 13 % (ref 11.5–15.5)
WBC: 17 10*3/uL — ABNORMAL HIGH (ref 4.0–10.5)

## 2017-08-08 LAB — I-STAT TROPONIN, ED: Troponin i, poc: 0.01 ng/mL (ref 0.00–0.08)

## 2017-08-08 LAB — COMPREHENSIVE METABOLIC PANEL
ALK PHOS: 84 U/L (ref 38–126)
ALT: 18 U/L (ref 14–54)
AST: 18 U/L (ref 15–41)
Albumin: 3.9 g/dL (ref 3.5–5.0)
Anion gap: 9 (ref 5–15)
BUN: 6 mg/dL (ref 6–20)
CALCIUM: 9.3 mg/dL (ref 8.9–10.3)
CHLORIDE: 99 mmol/L — AB (ref 101–111)
CO2: 26 mmol/L (ref 22–32)
CREATININE: 0.8 mg/dL (ref 0.44–1.00)
GFR calc Af Amer: >60 mL/min (ref >60)
Glucose, Bld: 110 mg/dL — ABNORMAL HIGH (ref 65–99)
Potassium: 3.8 mmol/L (ref 3.5–5.1)
Sodium: 134 mmol/L — ABNORMAL LOW (ref 135–145)
Total Bilirubin: 1.1 mg/dL (ref 0.3–1.2)
Total Protein: 8.2 g/dL — ABNORMAL HIGH (ref 6.5–8.1)

## 2017-08-08 LAB — I-STAT CG4 LACTIC ACID, ED
LACTIC ACID, VENOUS: 1.02 mmol/L (ref 0.5–1.9)
Lactic Acid, Venous: 0.81 mmol/L (ref 0.5–1.9)

## 2017-08-08 MED ORDER — DOXYCYCLINE HYCLATE 100 MG PO TABS
100.0000 mg | ORAL_TABLET | Freq: Once | ORAL | Status: AC
  Administered 2017-08-08: 100 mg via ORAL
  Filled 2017-08-08: qty 1

## 2017-08-08 MED ORDER — SODIUM CHLORIDE 0.9 % IV BOLUS (SEPSIS)
1000.0000 mL | Freq: Once | INTRAVENOUS | Status: AC
  Administered 2017-08-08: 1000 mL via INTRAVENOUS

## 2017-08-08 MED ORDER — KETOROLAC TROMETHAMINE 30 MG/ML IJ SOLN
30.0000 mg | Freq: Once | INTRAMUSCULAR | Status: AC
  Administered 2017-08-08: 30 mg via INTRAVENOUS
  Filled 2017-08-08: qty 1

## 2017-08-08 NOTE — ED Notes (Signed)
ED Provider at bedside. 

## 2017-08-08 NOTE — ED Triage Notes (Signed)
Pt to ER for evaluation of new onset fever, generalized body aches, chest pain, and bilateral lower extremity tingling. States noticed what appeared to be a spider bite on her epigastric area of abdomen yesterday, today it has grown significantly with induration and warmth, states has taken tylenol at 7 am and at 1 pm this afternoon, fever at current time is 103. Pt is a/o x4, in NAD.

## 2017-08-08 NOTE — ED Provider Notes (Signed)
[facility]-EMERGENCY DEPT Provider Note   CSN: [id number] Arrival date & time: 08/08/17  1559     History   Chief Complaint Chief Complaint  Patient presents with  . Fever  . Generalized Body Aches  . Chest Pain    HPI Debra Townsend is a 26 y.o. female.  Patient presents with complaint of fever and generalized body aches starting last night. She complains of significant myalgias, worse in her legs. She feels muscle aches in her chest and in her back. No known sick contacts or tick bites. She last took Tylenol at home at 1 PM. No URI symptoms, vomiting, diarrhea. No urinary symptoms. Patient states that she has had a firm area above her belly button that is become increasingly more painful over the past several days. She was recently treated for left axillary abscess which required incision and drainage. The onset of this condition was acute. The course is constant. Aggravating factors: none. Alleviating factors: none.        Past Medical History:  Diagnosis Date  . Anxiety   . Chlamydia   . Costochondral chest pain 01/03/2014  . Depression    No treatment, being monitored by Dr. Emelda Fear  . Herpes simplex without mention of complication   . Hypertension   . Miscarriage 02/01/2014  . Pregnant 01/03/2014  . Pulmonary embolism (HCC)   . Urinary tract infection     Patient Active Problem List   Diagnosis Date Noted  . History of pulmonary embolism 03/01/2017  . Encounter for insertion of mirena IUD 03/01/2017  . 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 Suicidal ideations   . Miscarriage 02/01/2014  . Costochondral chest pain 01/03/2014  . Herpes simplex type 2 infection 02/20/2013  . Chlamydia 02/20/2013  . Mild or unspecified pre-eclampsia, postpartum condition or complication 11/21/2012    Past Surgical History:  Procedure Laterality Date  .  CESAREAN SECTION    . CESAREAN SECTION  11/16/2012   Procedure: CESAREAN SECTION;  Surgeon: Catalina Antigua, MD;  Location: [facility];  Service: Obstetrics;  Laterality: N/A;  . THERAPEUTIC ABORTION      OB History    Gravida Para Term Preterm AB Living   5 3 3  0 2 3   SAB TAB Ectopic Multiple Live Births   0 1 0   3       Home Medications    Prior to Admission medications   Medication Sig Start Date End Date Taking? Authorizing Provider  acetaminophen (TYLENOL) 500 MG tablet Take 1,000 mg by mouth every 6 (six) hours as needed for moderate pain.   Yes [provider]  polyethylene glycol (MIRALAX) packet Take 17 g by mouth daily. Patient taking differently: Take 17 g by mouth daily as needed.  05/27/17  Yes Long, Arlyss Repress, MD  acetaZOLAMIDE (DIAMOX) 250 MG tablet Take 1 tablet twice a day for 1 week, then increase to 2 tablets twice a day Patient not taking: Reported on 03/01/2017 09/27/16   Van Clines, MD  doxycycline (VIBRAMYCIN) 100 MG capsule Take 1 capsule (100 mg total) by mouth 2 (two) times daily. Patient not taking: Reported on 08/08/2017 07/12/17   Deatra Canter, FNP  HYDROcodone-acetaminophen (NORCO/VICODIN) 5-325 MG tablet Take 2 tablets by mouth every 4 (four) hours as needed. Patient not taking: Reported on 08/08/2017 07/12/17   Deatra Canter, FNP    Family History Family  History  Problem Relation Age of Onset  . Hypertension Mother   . Heart disease Father   . Hypertension Father   . Heart disease Sister   . Hypertension Maternal Grandfather   . Hypertension Paternal Grandmother   . Diabetes Paternal Grandmother   . Kidney disease Paternal Grandmother   . Liver disease Paternal Grandmother   . Cancer Other        Breast, ovarian  . Diabetes Other   . Hypertension Other   . Stroke Other   . Liver disease Paternal Aunt   . Other Neg Hx     Social History Social History  Substance Use Topics  . Smoking status: Never Smoker  . Smokeless  tobacco: Never Used  . Alcohol use Yes     Comment: occasional     Allergies   Other and Penicillins   Review of Systems Review of Systems  Constitutional: Positive for fatigue and fever.  HENT: Negative for rhinorrhea and sore throat.   Eyes: Negative for redness.  Respiratory: Negative for cough.   Cardiovascular: Negative for chest pain.  Gastrointestinal: Negative for abdominal pain, diarrhea, nausea and vomiting.  Genitourinary: Negative for dysuria.  Musculoskeletal: Positive for myalgias.  Skin: Positive for color change. Negative for rash.       Positive for abscess.  Neurological: Negative for headaches.  Hematological: Negative for adenopathy.     Physical Exam Updated Vital Signs BP (!) 115/58   Pulse (!) 109   Temp 99.2 F (37.3 C) (Oral)   Resp (!) 21   SpO2 100%   Physical Exam  Constitutional: She appears well-developed and well-nourished.  HENT:  Head: Normocephalic and atraumatic.  Mouth/Throat: Oropharynx is clear and moist.  Eyes: Conjunctivae are normal. Right eye exhibits no discharge. Left eye exhibits no discharge.  Neck: Normal range of motion. Neck supple.  Cardiovascular: Normal rate, regular rhythm and normal heart sounds.   Pulmonary/Chest: Effort normal and breath sounds normal.  Abdominal: Soft. There is tenderness. There is no rebound and no guarding.  There is approximate 6 cm diameter of induration, no discrete fluctuance or drainage in the epigastrium superior to the umbilicus. Several centimeters of surrounding cellulitis noted.  Musculoskeletal: Normal range of motion.  Neurological: She is alert.  Skin: Skin is warm and dry. There is erythema.  Psychiatric: She has a normal mood and affect.  Nursing note and vitals reviewed.    ED Treatments / Results  Labs (all labs ordered are listed, but only abnormal results are displayed) Labs Reviewed  COMPREHENSIVE METABOLIC PANEL - Abnormal; Notable for the following:        Result Value   Sodium 134 (*)    Chloride 99 (*)    Glucose, Bld 110 (*)    Total Protein 8.2 (*)    All other components within normal limits  CBC WITH DIFFERENTIAL/PLATELET - Abnormal; Notable for the following:    WBC 17.0 (*)    Hemoglobin 11.9 (*)    Neutro Abs 14.8 (*)    All other components within normal limits  URINALYSIS, ROUTINE W REFLEX MICROSCOPIC  I-STAT CG4 LACTIC ACID, ED  I-STAT TROPONIN, ED  I-STAT CG4 LACTIC ACID, ED    EKG  EKG Interpretation None       Radiology Dg Chest 2 View  Result Date: 08/08/2017 CLINICAL DATA:  Right-sided chest pain with cough and fever EXAM: CHEST  2 VIEW COMPARISON:  05/27/2017 FINDINGS: The heart size and mediastinal contours are within normal limits.  Both lungs are clear. The visualized skeletal structures are unremarkable. IMPRESSION: No active cardiopulmonary disease. Electronically Signed   By: Jasmine Pang M.D.   On: 08/08/2017 18:24    Procedures Procedures (including critical care time)  Medications Ordered in ED Medications  sodium chloride 0.9 % bolus 1,000 mL (1,000 mLs Intravenous New Bag/Given 08/08/17 2158)  doxycycline (VIBRA-TABS) tablet 100 mg (100 mg Oral Given 08/08/17 2257)  ketorolac (TORADOL) 30 MG/ML injection 30 mg (30 mg Intravenous Given 08/08/17 2257)     Initial Impression / Assessment and Plan / ED Course  I have reviewed the triage vital signs and the nursing notes.  Pertinent labs & imaging results that were available during my care of the patient were reviewed by me and considered in my medical decision making (see chart for details).     Patient seen and examined. Work-up initiated. Medications ordered. Patient has flu-like illness however only objective finding is cellulitis/probable forming abscess.   Vital signs reviewed and are as follows: BP (!) 115/58   Pulse (!) 109   Temp 99.2 F (37.3 C) (Oral)   Resp (!) 21   SpO2 100%   EMERGENCY DEPARTMENT US SOFT TISSUE  INTERPRETATION "Study: Limited Soft Tissue Ultrasound"  INDICATIONS: Soft tissue infection Multiple views of the body part were obtained in real-time with a multi-frequency linear probe  PERFORMED BY: Myself IMAGES ARCHIVED?: Yes SIDE:Midline BODY PART:Abdominal wall INTERPRETATION:  No abcess noted and Cellulitis present   1:35 AM patient given oral antibiotics and hydrated. Overall she has done well in emergency department. She is feeling a bit better. Pending urine. Anticipate discharge when his results.  Discussed with patient and if area becomes more firm or enlarges, she will need follow-up either in emergency department or with her PCP in the next 48 hours. Pt urged to return with worsening pain, worsening swelling, expanding area of redness or streaking, persistent fever, or any other concerns. Urged to take complete course of antibiotics as prescribed. Pt verbalizes understanding and agrees with plan.   Final Clinical Impressions(s) / ED Diagnoses   Final diagnoses:  Abdominal wall cellulitis  Fever, unspecified fever cause   Patient with fever and myalgias, flulike appearance. Only localized infection noted on exam is abdominal wall cellulitis. Ultrasound used to further delineate and no abscess found. Other sources sought given atypical constellation of symptoms for cellulitis alone. No pneumonia found. No UTI. No history of tick bites. No URI. Patient may have a viral illness. She appears well and nontoxic. Lactate is not elevated. No leukocytosis is noted. Abdomen is otherwise soft and nontender. She appears well and nontoxic. Follow-up as above. She may have a developing abscess which would be drained in the next 1-2 days.  New Prescriptions New Prescriptions   DOXYCYCLINE (VIBRAMYCIN) 100 MG CAPSULE    Take 1 capsule (100 mg total) by mouth 2 (two) times daily.     Renne Crigler, PA-C 08/09/17 1610    Arby Barrette, MD 08/10/17 [phone]

## 2017-08-08 NOTE — ED Notes (Signed)
Pt ambulatory to restroom

## 2017-08-09 LAB — URINALYSIS, ROUTINE W REFLEX MICROSCOPIC
BILIRUBIN URINE: NEGATIVE
GLUCOSE, UA: NEGATIVE mg/dL
KETONES UR: NEGATIVE mg/dL
LEUKOCYTES UA: NEGATIVE
Nitrite: NEGATIVE
PH: 6 (ref 5.0–8.0)
PROTEIN: 30 mg/dL — AB
Specific Gravity, Urine: 1.004 — ABNORMAL LOW (ref 1.005–1.030)

## 2017-08-09 MED ORDER — DOXYCYCLINE HYCLATE 100 MG PO CAPS: 100.0000 mg | ORAL_CAPSULE | Freq: Two times a day (BID) | ORAL | 0 refills | Status: AC

## 2017-08-09 NOTE — Discharge Instructions (Signed)
Please read and follow all provided instructions.  Your diagnoses today include:  1. Abdominal wall cellulitis   2. Fever, unspecified fever cause     Tests performed today include:  Vital signs. See below for your results today.   Blood counts and electrolytes  Blood tests for severe infection - no signs of severe infection  Urine test  Chest x-ray - no pneumonia  Medications prescribed:   Doxycycline - antibiotic  You have been prescribed an antibiotic medicine: take the entire course of medicine even if you are feeling better. Stopping early can cause the antibiotic not to work.  Take any prescribed medications only as directed.   Home care instructions:  Follow any educational materials contained in this packet. Keep affected area above the level of your heart when possible. Wash area gently twice a day with warm soapy water. Do not apply alcohol or hydrogen peroxide. Cover the area if it draining or weeping.   Follow-up instructions: Return to the Emergency Department in 48 hours for a recheck if your symptoms are not significantly improved.   Please follow-up with your primary care provider in the next 1 week for further evaluation of your symptoms.   Return instructions:  Return to the Emergency Department if you have:  Fever  Worsening symptoms  Worsening pain  Worsening swelling  Redness of the skin that moves away from the affected area, especially if it streaks away from the affected area   Any other emergent concerns  Your vital signs today were: BP 120/78    Pulse (!) 110    Temp 99.2 F (37.3 C) (Oral)    Resp (!) 29    SpO2 100%  If your blood pressure (BP) was elevated above 135/85 this visit, please have this repeated by your doctor within one month. --------------

## 2017-08-10 ENCOUNTER — Emergency Department (HOSPITAL_COMMUNITY)
Admission: EM | Admit: 2017-08-10 | Discharge: 2017-08-11 | Disposition: A | Discharge status: Home / Self Care | Payer: No Typology Code available for payment source | Attending: Emergency Medicine | Admitting: Emergency Medicine

## 2017-08-10 ENCOUNTER — Encounter (HOSPITAL_COMMUNITY): Payer: Self-pay

## 2017-08-10 DIAGNOSIS — I10 Essential (primary) hypertension: Secondary | ICD-10-CM | POA: Insufficient documentation

## 2017-08-10 DIAGNOSIS — L02211 Cutaneous abscess of abdominal wall: Secondary | ICD-10-CM | POA: Insufficient documentation

## 2017-08-10 DIAGNOSIS — L0291 Cutaneous abscess, unspecified: Secondary | ICD-10-CM

## 2017-08-10 MED ORDER — LIDOCAINE HCL (PF) 1 % IJ SOLN
10.0000 mL | Freq: Once | INTRAMUSCULAR | Status: AC
  Administered 2017-08-11: 10 mL
  Filled 2017-08-10: qty 30

## 2017-08-10 NOTE — ED Triage Notes (Signed)
States was seen at [facility] 2 days ago for redness on abdomen states was told to come back to get it drained placed on antibiotics for infection states pain 10/10.

## 2017-08-11 NOTE — ED Provider Notes (Signed)
[facility]-EMERGENCY DEPT Provider Note   CSN: [id number] Arrival date & time: 08/10/17  2120     History   Chief Complaint Chief Complaint  Patient presents with  . Wound Check    HPI Debra Townsend is a 26 y.o. female.  Patient presents to the ER for evaluation of abscess on abdomen. Patient was evaluated 2 days ago and was told that he really was not ready for drainage. Patient reports that it is more swollen and more painful, thinks it needs to be drained tonight. She has not had any fevers. She has been taking doxycycline for 2 days.      Past Medical History:  Diagnosis Date  . Anxiety   . Chlamydia   . Costochondral chest pain 01/03/2014  . Depression    No treatment, being monitored by Dr. Emelda Fear  . Herpes simplex without mention of complication   . Hypertension   . Miscarriage 02/01/2014  . Pregnant 01/03/2014  . Pulmonary embolism (HCC)   . Urinary tract infection     Patient Active Problem List   Diagnosis Date Noted  . History of pulmonary embolism 03/01/2017  . Encounter for insertion of mirena IUD 03/01/2017  . 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 Suicidal ideations   . Miscarriage 02/01/2014  . Costochondral chest pain 01/03/2014  . Herpes simplex type 2 infection 02/20/2013  . Chlamydia 02/20/2013  . Mild or unspecified pre-eclampsia, postpartum condition or complication 11/21/2012    Past Surgical History:  Procedure Laterality Date  . CESAREAN SECTION    . CESAREAN SECTION  11/16/2012   Procedure: CESAREAN SECTION;  Surgeon: Catalina Antigua, MD;  Location: [facility] ORS;  Service: Obstetrics;  Laterality: N/A;  . THERAPEUTIC ABORTION      OB History    Gravida Para Term Preterm AB Living   5 3 3  0 2 3   SAB TAB Ectopic Multiple Live Births   0 1 0   3       Home Medications    Prior to Admission medications     Medication Sig Start Date End Date Taking? Authorizing Provider  acetaminophen (TYLENOL) 500 MG tablet Take 1,000 mg by mouth every 6 (six) hours as needed for moderate pain.    [provider]  doxycycline (VIBRAMYCIN) 100 MG capsule Take 1 capsule (100 mg total) by mouth 2 (two) times daily. 08/09/17   Renne Crigler, PA-C  polyethylene glycol (MIRALAX) packet Take 17 g by mouth daily. Patient taking differently: Take 17 g by mouth daily as needed.  05/27/17   Long, Arlyss Repress, MD    Family History Family History  Problem Relation Age of Onset  . Hypertension Mother   . Heart disease Father   . Hypertension Father   . Heart disease Sister   . Hypertension Maternal Grandfather   . Hypertension Paternal Grandmother   . Diabetes Paternal Grandmother   . Kidney disease Paternal Grandmother   . Liver disease Paternal Grandmother   . Cancer Other        Breast, ovarian  . Diabetes Other   . Hypertension Other   . Stroke Other   . Liver disease Paternal Aunt   . Other Neg Hx     Social History Social History  Substance Use Topics  . Smoking status: Never Smoker  . Smokeless tobacco: Never Used  . Alcohol use Yes  Comment: occasional     Allergies   Other and Penicillins   Review of Systems Review of Systems  Skin: Positive for wound.  All other systems reviewed and are negative.    Physical Exam Updated Vital Signs BP 111/74 (BP Location: Right Arm)   Pulse 96   Temp 99.8 F (37.7 C) (Oral)   Resp 18   SpO2 100%   Physical Exam  Constitutional: She is oriented to person, place, and time. She appears well-developed and well-nourished. No distress.  HENT:  Head: Normocephalic and atraumatic.  Right Ear: Hearing normal.  Left Ear: Hearing normal.  Nose: Nose normal.  Mouth/Throat: Oropharynx is clear and moist and mucous membranes are normal.  Eyes: Pupils are equal, round, and reactive to light. Conjunctivae and EOM are normal.  Neck: Normal range  of motion. Neck supple.  Cardiovascular: Regular rhythm, S1 normal and S2 normal.  Exam reveals no gallop and no friction rub.   No murmur heard. Pulmonary/Chest: Effort normal and breath sounds normal. No respiratory distress. She exhibits no tenderness.  Abdominal: Soft. Normal appearance and bowel sounds are normal. There is no hepatosplenomegaly. There is no tenderness. There is no rebound, no guarding, no tenderness at McBurney's point and negative Murphy's sign. No hernia.  Musculoskeletal: Normal range of motion.  Neurological: She is alert and oriented to person, place, and time. She has normal strength. No cranial nerve deficit or sensory deficit. Coordination normal. GCS eye subscore is 4. GCS verbal subscore is 5. GCS motor subscore is 6.  Skin: Skin is warm, dry and intact. No rash noted. No cyanosis.  Tender, erythematous, warm, indurated abdomen central abdomen  Psychiatric: She has a normal mood and affect. Her speech is normal and behavior is normal. Thought content normal.  Nursing note and vitals reviewed.    ED Treatments / Results  Labs (all labs ordered are listed, but only abnormal results are displayed) Labs Reviewed - No data to display  EKG  EKG Interpretation None       Radiology No results found.  Procedures .Marland Kitchen.Incision and Drainage Date/Time: 08/11/2017 12:03 AM Performed by: Gilda CreasePOLLINA, CHRISTOPHER J Authorized by: Gilda CreasePOLLINA, CHRISTOPHER J   Consent:    Consent obtained:  Verbal   Consent given by:  Patient   Risks discussed:  Bleeding, incomplete drainage and pain   Alternatives discussed:  Delayed treatment Universal protocol:    Procedure explained and questions answered to patient or proxy's satisfaction: yes     Site/side marked: yes     Immediately prior to procedure a time out was called: yes     Patient identity confirmed:  Verbally with patient Location:    Type:  Abscess   Size:  3cm   Location:  Trunk   Trunk location:   Abdomen Pre-procedure details:    Skin preparation:  Betadine Anesthesia (see MAR for exact dosages):    Anesthesia method:  Local infiltration   Local anesthetic:  Lidocaine 2% w/o epi Procedure type:    Complexity:  Complex Procedure details:    Needle aspiration: no     Incision types:  Single straight   Incision depth:  Subcutaneous   Scalpel blade:  11   Wound management:  Probed and deloculated, irrigated with saline and extensive cleaning   Drainage:  Purulent   Drainage amount:  Copious   Wound treatment:  Wound left open   Packing materials:  1/4 in gauze Post-procedure details:    Patient tolerance of procedure:  Tolerated well,  no immediate complications   (including critical care time)  Medications Ordered in ED Medications  lidocaine (PF) (XYLOCAINE) 1 % injection 10 mL (not administered)     Initial Impression / Assessment and Plan / ED Course  I have reviewed the triage vital signs and the nursing notes.  Pertinent labs & imaging results that were available during my care of the patient were reviewed by me and considered in my medical decision making (see chart for details).     Area is tense and indurated. I did agree with patient, recommended incision and drainage. This was performed and a large amount of pus was drained. Packing was in place. Patient counseled that she can remove packing in 2 days if she feels comfortable, otherwise return to ER. Return to the ER at any point if area worsens. Continue doxycycline.  Final Clinical Impressions(s) / ED Diagnoses   Final diagnoses:  Abscess    New Prescriptions New Prescriptions   No medications on file     Gilda Crease, MD 08/11/17 [phone]

## 2017-10-12 ENCOUNTER — Encounter (INDEPENDENT_AMBULATORY_CARE_PROVIDER_SITE_OTHER): Payer: Self-pay

## 2017-10-12 ENCOUNTER — Other Ambulatory Visit: Payer: Medicaid Other | Admitting: Adult Health

## 2017-10-12 IMAGING — DX DG ABDOMEN ACUTE W/ 1V CHEST
3 series · 3 of 3 positions shown · non-contrast
Comparison: Abdominal and pelvic CT scan dated June 23, 2015.

CLINICAL DATA: Right lower quadrant pain when attempting to have a
bowel movement. Also perianal pain during defecation. Duration of
symptoms 1-2 months.

EXAM:
DG ABDOMEN ACUTE W/ 1V CHEST

[chest pa]
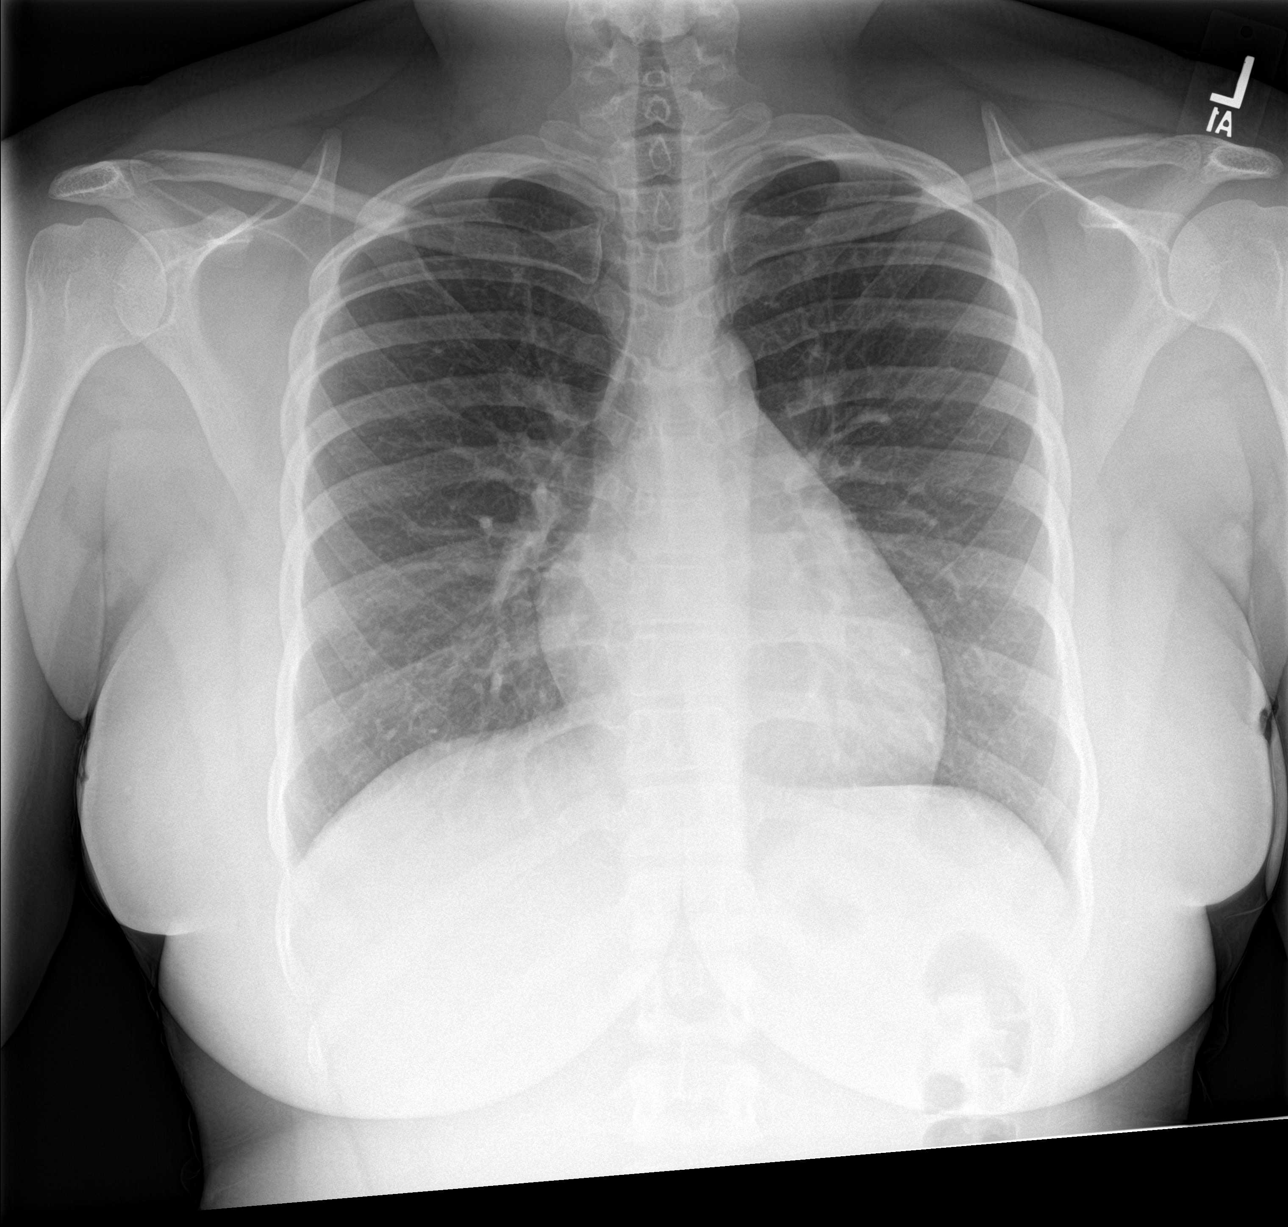

[abdomen erect]
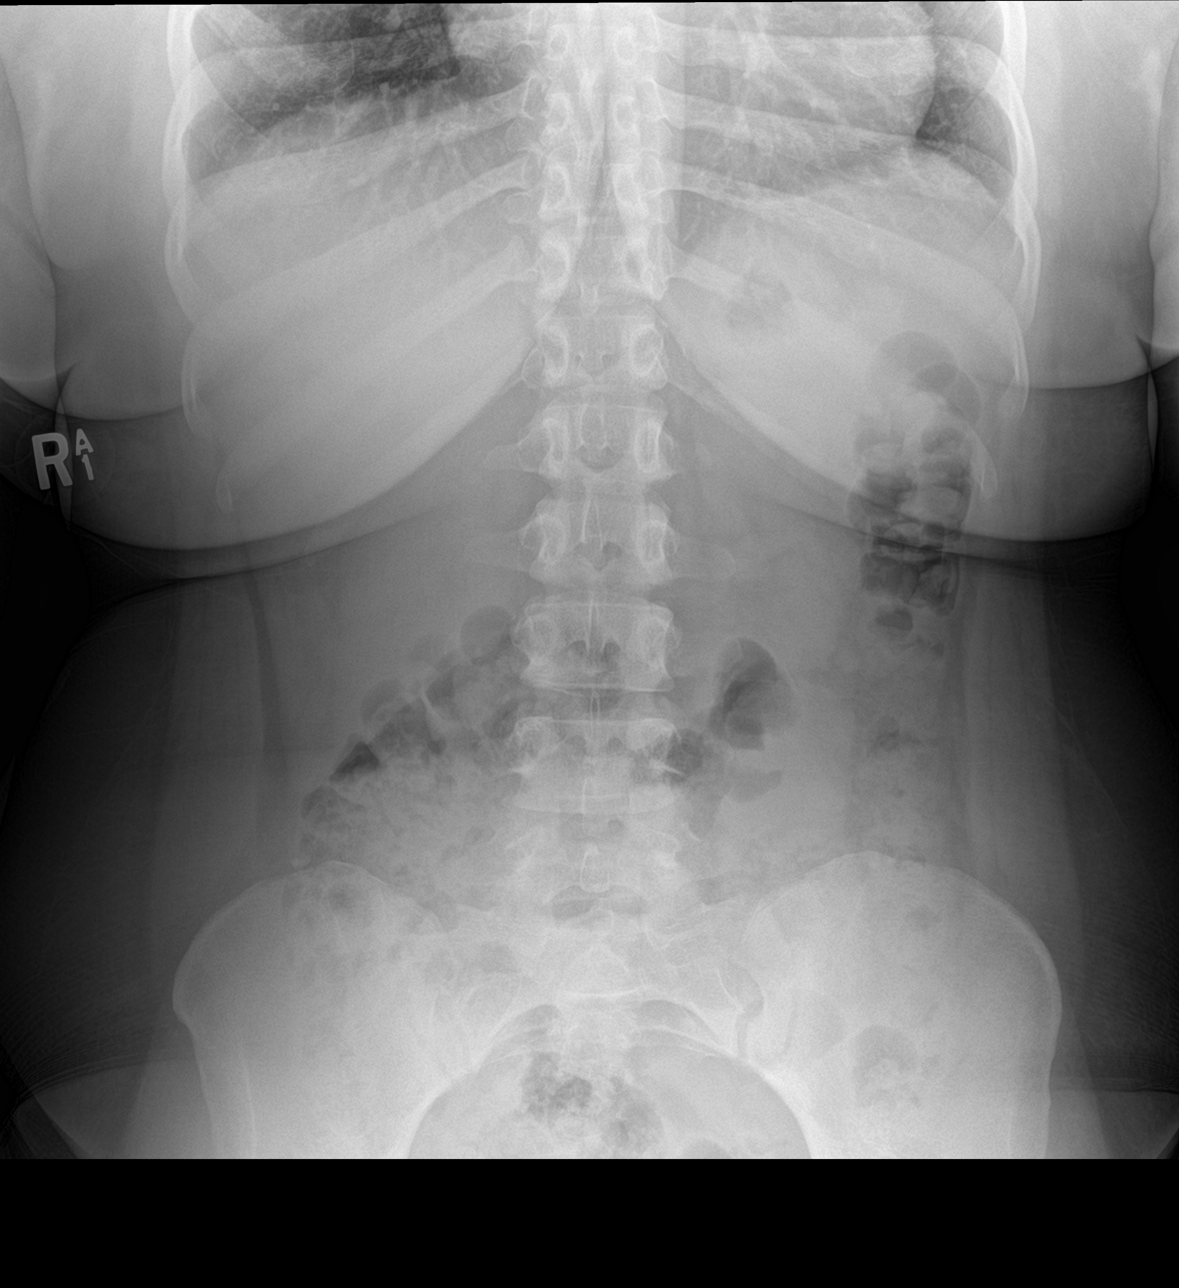

[abdomen supine]
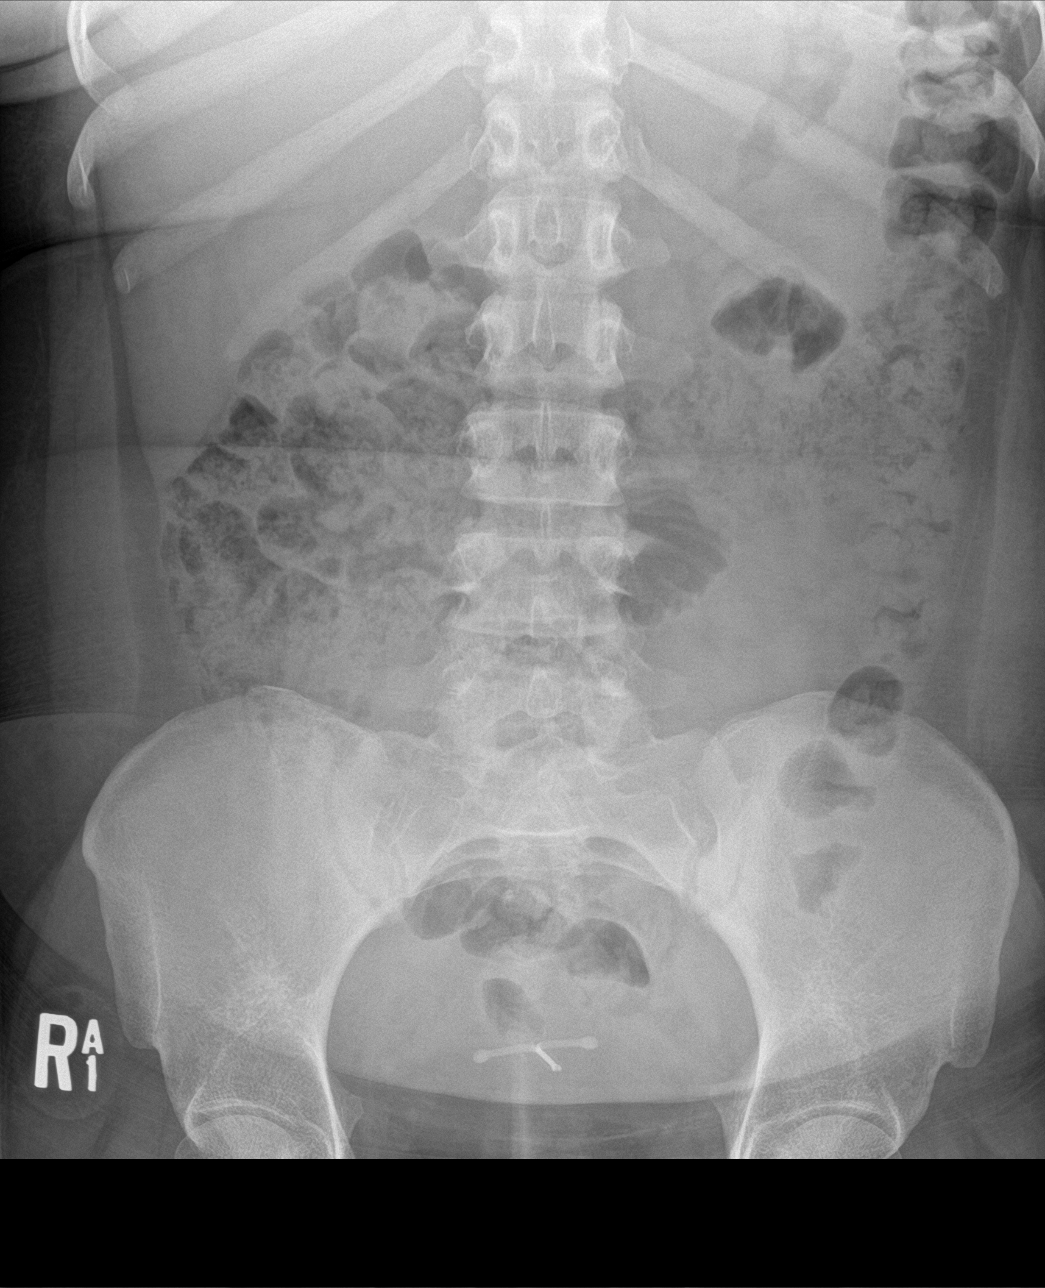

[3 of 3 positions shown; findings below may reference images not displayed]

FINDINGS: The lungs are well-expanded and clear. The heart and mediastinum are
normal. There is no pleural effusion. The bony thorax is
unremarkable.

Within the abdomen the colonic stool burden is increased diffusely.
There is no evidence of a fecal impaction. No small bowel
obstructive pattern is observed. An IUD is present. There are no
abnormal soft tissue calcifications. The bony structures are
unremarkable.
IMPRESSION: There is no acute cardiopulmonary abnormality.

Increased colonic stool burden compatible with constipation in the
appropriate clinical setting.

## 2017-10-19 ENCOUNTER — Other Ambulatory Visit: Payer: Medicaid Other | Admitting: Adult Health

## 2017-10-24 ENCOUNTER — Other Ambulatory Visit: Payer: Medicaid Other | Admitting: Women's Health

## 2017-11-23 ENCOUNTER — Ambulatory Visit: Payer: Self-pay | Admitting: Neurology

## 2018-01-03 ENCOUNTER — Emergency Department (HOSPITAL_COMMUNITY)
Admission: EM | Admit: 2018-01-03 | Discharge: 2018-01-03 | Disposition: A | Discharge status: Home / Self Care | Payer: Managed Care, Other (non HMO) | Attending: Emergency Medicine | Admitting: Emergency Medicine

## 2018-01-03 ENCOUNTER — Emergency Department (HOSPITAL_COMMUNITY): Payer: Managed Care, Other (non HMO)

## 2018-01-03 ENCOUNTER — Encounter (HOSPITAL_COMMUNITY): Payer: Self-pay | Admitting: *Deleted

## 2018-01-03 ENCOUNTER — Other Ambulatory Visit: Payer: Self-pay

## 2018-01-03 DIAGNOSIS — M7918 Myalgia, other site: Secondary | ICD-10-CM | POA: Diagnosis present

## 2018-01-03 DIAGNOSIS — R112 Nausea with vomiting, unspecified: Secondary | ICD-10-CM | POA: Insufficient documentation

## 2018-01-03 DIAGNOSIS — Z79899 Other long term (current) drug therapy: Secondary | ICD-10-CM | POA: Diagnosis not present

## 2018-01-03 DIAGNOSIS — B349 Viral infection, unspecified: Secondary | ICD-10-CM | POA: Insufficient documentation

## 2018-01-03 DIAGNOSIS — R109 Unspecified abdominal pain: Secondary | ICD-10-CM | POA: Insufficient documentation

## 2018-01-03 DIAGNOSIS — I10 Essential (primary) hypertension: Secondary | ICD-10-CM | POA: Insufficient documentation

## 2018-01-03 LAB — URINALYSIS, ROUTINE W REFLEX MICROSCOPIC
Bilirubin Urine: NEGATIVE
GLUCOSE, UA: NEGATIVE mg/dL
HGB URINE DIPSTICK: NEGATIVE
Ketones, ur: NEGATIVE mg/dL
NITRITE: NEGATIVE
PH: 7 (ref 5.0–8.0)
PROTEIN: 100 mg/dL — AB
SPECIFIC GRAVITY, URINE: 1.029 (ref 1.005–1.030)

## 2018-01-03 LAB — CBC WITH DIFFERENTIAL/PLATELET
BASOS ABS: 0 10*3/uL (ref 0.0–0.1)
Basophils Relative: 0 %
EOS ABS: 0 10*3/uL (ref 0.0–0.7)
EOS PCT: 0 %
HCT: 37.3 % (ref 36.0–46.0)
Hemoglobin: 11.9 g/dL — ABNORMAL LOW (ref 12.0–15.0)
LYMPHS PCT: 12 %
Lymphs Abs: 1 10*3/uL (ref 0.7–4.0)
MCH: 26.2 pg (ref 26.0–34.0)
MCHC: 31.9 g/dL (ref 30.0–36.0)
MCV: 82.2 fL (ref 78.0–100.0)
MONO ABS: 0.4 10*3/uL (ref 0.1–1.0)
Monocytes Relative: 5 %
Neutro Abs: 6.7 10*3/uL (ref 1.7–7.7)
Neutrophils Relative %: 83 %
PLATELETS: 215 10*3/uL (ref 150–400)
RBC: 4.54 MIL/uL (ref 3.87–5.11)
RDW: 13.3 % (ref 11.5–15.5)
WBC: 8 10*3/uL (ref 4.0–10.5)

## 2018-01-03 LAB — COMPREHENSIVE METABOLIC PANEL
ALT: 18 U/L (ref 14–54)
AST: 17 U/L (ref 15–41)
Albumin: 4 g/dL (ref 3.5–5.0)
Alkaline Phosphatase: 77 U/L (ref 38–126)
Anion gap: 8 (ref 5–15)
BILIRUBIN TOTAL: 0.6 mg/dL (ref 0.3–1.2)
BUN: 11 mg/dL (ref 6–20)
CHLORIDE: 101 mmol/L (ref 101–111)
CO2: 25 mmol/L (ref 22–32)
CREATININE: 0.65 mg/dL (ref 0.44–1.00)
Calcium: 9 mg/dL (ref 8.9–10.3)
GFR calc Af Amer: >60 mL/min (ref >60)
Glucose, Bld: 116 mg/dL — ABNORMAL HIGH (ref 65–99)
Potassium: 3.8 mmol/L (ref 3.5–5.1)
Sodium: 134 mmol/L — ABNORMAL LOW (ref 135–145)
Total Protein: 8.4 g/dL — ABNORMAL HIGH (ref 6.5–8.1)

## 2018-01-03 LAB — LIPASE, BLOOD: LIPASE: 24 U/L (ref 11–51)

## 2018-01-03 LAB — PREGNANCY, URINE: PREG TEST UR: NEGATIVE

## 2018-01-03 MED ORDER — ONDANSETRON 4 MG PO TBDP
4.0000 mg | ORAL_TABLET | Freq: Once | ORAL | Status: AC
  Administered 2018-01-03: 4 mg via ORAL
  Filled 2018-01-03: qty 1

## 2018-01-03 MED ORDER — ONDANSETRON 4 MG PO TBDP: 4.0000 mg | ORAL_TABLET | Freq: Three times a day (TID) | ORAL | 0 refills | Status: AC | PRN

## 2018-01-03 MED ORDER — ACETAMINOPHEN 325 MG PO TABS
650.0000 mg | ORAL_TABLET | Freq: Once | ORAL | Status: AC
  Administered 2018-01-03: 650 mg via ORAL
  Filled 2018-01-03: qty 2

## 2018-01-03 MED ORDER — IOPAMIDOL (ISOVUE-300) INJECTION 61%
100.0000 mL | Freq: Once | INTRAVENOUS | Status: AC | PRN
  Administered 2018-01-03: 100 mL via INTRAVENOUS

## 2018-01-03 NOTE — ED Provider Notes (Signed)
[facility] EMERGENCY DEPARTMENT Provider Note   CSN: [id number] Arrival date & time: 01/03/18  0046     History   Chief Complaint Chief Complaint  Patient presents with  . Generalized Body Aches    HPI Debra Townsend is a 27 y.o. female.  Presents with a 1 day history of diffuse body aches, chills headache, cough and sore throat.  States she was able to drink all day normally until tonight when she had about 4 episodes of nonbloody nonbilious emesis all the sudden.  Denies any diarrhea.  Denies these episodes are posttussive.  Denies any rhinorrhea or sore throat.  Denies sick contacts at home.  Did receive a flu shot.  Denies any specific abdominal pain but has soreness from vomiting.  Denies any suspicious food intake or recent travel. She did not take any medications at home.   The history is provided by the patient.    Past Medical History:  Diagnosis Date  . Anxiety   . Chlamydia   . Costochondral chest pain 01/03/2014  . Depression    No treatment, being monitored by Dr. Emelda FearFerguson  . Herpes simplex without mention of complication   . Hypertension   . Miscarriage 02/01/2014  . Pregnant 01/03/2014  . Pulmonary embolism (HCC)   . Urinary tract infection     Patient Active Problem List   Diagnosis Date Noted  . History of pulmonary embolism 03/01/2017  . Encounter for insertion of mirena IUD 03/01/2017  . 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 Suicidal ideations   . Miscarriage 02/01/2014  . Costochondral chest pain 01/03/2014  . Herpes simplex type 2 infection 02/20/2013  . Chlamydia 02/20/2013  . Mild or unspecified pre-eclampsia, postpartum condition or complication 11/21/2012    Past Surgical History:  Procedure Laterality Date  . CESAREAN SECTION    . CESAREAN SECTION  11/16/2012   Procedure: CESAREAN SECTION;  Surgeon: Catalina AntiguaPeggy  Constant, MD;  Location: [facility] ORS;  Service: Obstetrics;  Laterality: N/A;  . THERAPEUTIC ABORTION      OB History    Gravida Para Term Preterm AB Living   5 3 3  0 2 3   SAB TAB Ectopic Multiple Live Births   0 1 0   3       Home Medications    Prior to Admission medications   Medication Sig Start Date End Date Taking? Authorizing Provider  acetaminophen (TYLENOL) 500 MG tablet Take 1,000 mg by mouth every 6 (six) hours as needed for moderate pain.    [provider]  doxycycline (VIBRAMYCIN) 100 MG capsule Take 1 capsule (100 mg total) by mouth 2 (two) times daily. 08/09/17   Renne CriglerGeiple, Joshua, PA-C  polyethylene glycol (MIRALAX) packet Take 17 g by mouth daily. Patient taking differently: Take 17 g by mouth daily as needed.  05/27/17   Long, Arlyss RepressJoshua G, MD    Family History Family History  Problem Relation Age of Onset  . Hypertension Mother   . Heart disease Father   . Hypertension Father   . Heart disease Sister   . Hypertension Maternal Grandfather   . Hypertension Paternal Grandmother   . Diabetes Paternal Grandmother   . Kidney disease Paternal Grandmother   . Liver disease Paternal Grandmother   . Cancer Other        Breast, ovarian  . Diabetes Other   . Hypertension Other   .  Stroke Other   . Liver disease Paternal Aunt   . Other Neg Hx     Social History Social History   Tobacco Use  . Smoking status: Never Smoker  . Smokeless tobacco: Never Used  Substance Use Topics  . Alcohol use: Yes    Comment: occasional  . Drug use: No    Comment: Last used 2 months ago     Allergies   Other and Penicillins   Review of Systems Review of Systems  Constitutional: Positive for activity change, appetite change and chills. Negative for fever.  HENT: Positive for sore throat. Negative for ear discharge.   Respiratory: Positive for cough. Negative for chest tightness and shortness of breath.   Cardiovascular: Negative for chest pain.  Gastrointestinal:  Positive for abdominal pain, nausea and vomiting. Negative for diarrhea.  Genitourinary: Negative for vaginal bleeding and vaginal discharge.  Musculoskeletal: Positive for arthralgias and myalgias.  Skin: Negative for rash.  Neurological: Positive for weakness and headaches. Negative for dizziness.     Physical Exam Updated Vital Signs BP 125/85 (BP Location: Left Arm)   Pulse (!) 109   Temp 98.3 F (36.8 C) (Oral)   Resp 20   Ht 5' (1.524 m)   Wt 83.5 kg (184 lb)   LMP 11/26/2017   SpO2 100%   BMI 35.94 kg/m   Physical Exam  Constitutional: She is oriented to person, place, and time. She appears well-developed and well-nourished. No distress.  Nontoxic appearing  HENT:  Head: Normocephalic and atraumatic.  Mouth/Throat: Oropharynx is clear and moist. No oropharyngeal exudate.  Moist mucus membranes  Eyes: Conjunctivae and EOM are normal. Pupils are equal, round, and reactive to light.  Neck: Normal range of motion. Neck supple.  No meningismus.  Cardiovascular: Normal rate, regular rhythm, normal heart sounds and intact distal pulses.  No murmur heard. Pulmonary/Chest: Effort normal and breath sounds normal. No respiratory distress. She exhibits no tenderness.  Abdominal: Soft. There is tenderness. There is no rebound and no guarding.  Mild RLQ tenderness without guarding  Musculoskeletal: Normal range of motion. She exhibits no edema or tenderness.  No CVAT  Neurological: She is alert and oriented to person, place, and time. No cranial nerve deficit. She exhibits normal muscle tone. Coordination normal.   5/5 strength throughout. CN 2-12 intact.Equal grip strength.   Skin: Skin is warm. Capillary refill takes less than 2 seconds.  Psychiatric: She has a normal mood and affect. Her behavior is normal.  Nursing note and vitals reviewed.    ED Treatments / Results  Labs (all labs ordered are listed, but only abnormal results are displayed) Labs Reviewed  URINALYSIS,  ROUTINE W REFLEX MICROSCOPIC - Abnormal; Notable for the following components:      Result Value   APPearance HAZY (*)    Protein, ur 100 (*)    Leukocytes, UA TRACE (*)    Bacteria, UA RARE (*)    Squamous Epithelial / LPF 6-30 (*)    All other components within normal limits  COMPREHENSIVE METABOLIC PANEL - Abnormal; Notable for the following components:   Sodium 134 (*)    Glucose, Bld 116 (*)    Total Protein 8.4 (*)    All other components within normal limits  CBC WITH DIFFERENTIAL/PLATELET - Abnormal; Notable for the following components:   Hemoglobin 11.9 (*)    All other components within normal limits  PREGNANCY, URINE  LIPASE, BLOOD  CBC WITH DIFFERENTIAL/PLATELET    EKG  EKG Interpretation  None       Radiology Ct Abdomen Pelvis W Contrast  Result Date: 01/03/2018 CLINICAL DATA:  Generalized body aches, nausea, and vomiting starting today. EXAM: CT ABDOMEN AND PELVIS WITH CONTRAST TECHNIQUE: Multidetector CT imaging of the abdomen and pelvis was performed using the standard protocol following bolus administration of intravenous contrast. CONTRAST:  ISOVUE-300 IOPAMIDOL (ISOVUE-300) INJECTION 61% COMPARISON:  06/23/2015 FINDINGS: Lower chest: The lung bases are clear. Hepatobiliary: No focal liver abnormality is seen. No gallstones, gallbladder wall thickening, or biliary dilatation. Pancreas: Unremarkable. No pancreatic ductal dilatation or surrounding inflammatory changes. Spleen: Normal in size without focal abnormality. Adrenals/Urinary Tract: Adrenal glands are unremarkable. Kidneys are normal, without renal calculi, focal lesion, or hydronephrosis. Bladder is unremarkable. Stomach/Bowel: Stomach is within normal limits. Appendix appears normal. No evidence of bowel wall thickening, distention, or inflammatory changes. Vascular/Lymphatic: No significant vascular findings are present. No enlarged abdominal or pelvic lymph nodes. Reproductive: Uterus and bilateral  adnexa are unremarkable. Other: No abdominal wall hernia or abnormality. No abdominopelvic ascites. Musculoskeletal: No acute or significant osseous findings. IMPRESSION: No acute process demonstrated in the abdomen or pelvis. No evidence of bowel obstruction or inflammation. Appendix is normal. Electronically Signed   By: Burman Nieves M.D.   On: 01/03/2018 03:39    Procedures Procedures (including critical care time)  Medications Ordered in ED Medications  ondansetron (ZOFRAN-ODT) disintegrating tablet 4 mg (not administered)  acetaminophen (TYLENOL) tablet 650 mg (not administered)     Initial Impression / Assessment and Plan / ED Course  I have reviewed the triage vital signs and the nursing notes.  Pertinent labs & imaging results that were available during my care of the patient were reviewed by me and considered in my medical decision making (see chart for details).    Patient presents with 1 day of myalgias, headache, body aches nausea and vomiting.  Abdomen is soft and non-peritoneal.  HCG is negative.  Urinalysis is negative.  Patient given p.o. fluids and is tolerating well without issues.  Suspect viral syndrome.  On recheck, patient has more right lower quadrant tenderness with some voluntary guarding.  Discussed possibility of early appendicitis and patient willing to proceed with CT scan.  No leukocytosis.  CT scan shows normal appendix and no other acute pathology.  Patient tolerating p.o.  We will treat supportively with antiemetics and antipyretics.  Oral hydration at home.  Follow-up with PCP.  Return precautions discussed. Final Clinical Impressions(s) / ED Diagnoses   Final diagnoses:  Viral syndrome  Non-intractable vomiting with nausea, unspecified vomiting type    ED Discharge Orders    None       Traves Majchrzak, Jeannett Senior, MD 01/03/18 0530

## 2018-01-03 NOTE — ED Notes (Signed)
Pt given water for fluid challenge 

## 2018-01-03 NOTE — ED Notes (Signed)
Pt has not vomited since consuming cup of water earlier.

## 2018-01-03 NOTE — ED Triage Notes (Signed)
Pt c/o generalized body aches with n/v that started today

## 2018-01-03 NOTE — Discharge Instructions (Signed)
Keep yourself hydrated.  Follow-up with your doctor.  Take the nausea medication as prescribed.  Return to the ED if you develop new or worsening symptoms.
